# Patient Record
Sex: Male | Born: 1990
Health system: Southern US, Community
[De-identification: ages and names within clinical notes are randomized; demographics above are authoritative.]

## PROBLEM LIST (undated history)

## (undated) DIAGNOSIS — F419 Anxiety disorder, unspecified: Secondary | ICD-10-CM

---
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
## 2009-01-31 ENCOUNTER — Emergency Department (HOSPITAL_COMMUNITY): Admission: EM | Admit: 2009-01-31 | Discharge: 2009-01-31 | Payer: Self-pay | Admitting: Family Medicine

## 2009-04-07 ENCOUNTER — Emergency Department (HOSPITAL_COMMUNITY): Admission: EM | Admit: 2009-04-07 | Discharge: 2009-04-07 | Payer: Self-pay | Admitting: Emergency Medicine

## 2010-05-28 LAB — COMPREHENSIVE METABOLIC PANEL
Albumin: 4.4 g/dL (ref 3.5–5.2)
Alkaline Phosphatase: 99 U/L (ref 39–117)
BUN: 4 mg/dL — ABNORMAL LOW (ref 6–23)
Calcium: 9.4 mg/dL (ref 8.4–10.5)
Creatinine, Ser: 0.88 mg/dL (ref 0.4–1.5)
Glucose, Bld: 91 mg/dL (ref 70–99)
Total Protein: 7.7 g/dL (ref 6.0–8.3)

## 2010-05-28 LAB — RAPID URINE DRUG SCREEN, HOSP PERFORMED
Barbiturates: NOT DETECTED
Benzodiazepines: NOT DETECTED
Cocaine: POSITIVE — AB
Opiates: NOT DETECTED

## 2010-05-28 LAB — CBC
HCT: 49.5 % (ref 39.0–52.0)
Hemoglobin: 17.4 g/dL — ABNORMAL HIGH (ref 13.0–17.0)
MCHC: 35.2 g/dL (ref 30.0–36.0)
MCV: 94.8 fL (ref 78.0–100.0)
Platelets: 253 10*3/uL (ref 150–400)
RDW: 12.3 % (ref 11.5–15.5)

## 2010-05-28 LAB — LIPASE, BLOOD: Lipase: 17 U/L (ref 11–59)

## 2010-11-17 LAB — POCT I-STAT, CHEM 8
Calcium, Ion: 1.22
Creatinine, Ser: 1
Glucose, Bld: 80
Hemoglobin: 16
Potassium: 3.9

## 2010-11-21 LAB — HSV 2 ANTIBODY, IGG: HSV 2 Glycoprotein G Ab, IgG: 0.07 IV

## 2010-11-26 LAB — URINALYSIS, ROUTINE W REFLEX MICROSCOPIC
Nitrite: NEGATIVE
Specific Gravity, Urine: 1.017
Urobilinogen, UA: 1
pH: 8

## 2010-11-26 LAB — URINE MICROSCOPIC-ADD ON

## 2010-11-26 LAB — GC/CHLAMYDIA PROBE AMP, GENITAL: Chlamydia, DNA Probe: NEGATIVE

## 2011-10-18 ENCOUNTER — Emergency Department (HOSPITAL_COMMUNITY)
Admission: EM | Admit: 2011-10-18 | Discharge: 2011-10-18 | Disposition: A | Discharge status: Home / Self Care | Payer: Self-pay | Attending: Emergency Medicine | Admitting: Emergency Medicine

## 2011-10-18 ENCOUNTER — Encounter (HOSPITAL_COMMUNITY): Payer: Self-pay | Admitting: *Deleted

## 2011-10-18 DIAGNOSIS — R112 Nausea with vomiting, unspecified: Secondary | ICD-10-CM | POA: Insufficient documentation

## 2011-10-18 DIAGNOSIS — R109 Unspecified abdominal pain: Secondary | ICD-10-CM | POA: Insufficient documentation

## 2011-10-18 LAB — CBC WITH DIFFERENTIAL/PLATELET
Basophils Relative: 0 % (ref 0–1)
Eosinophils Absolute: 0 10*3/uL (ref 0.0–0.7)
HCT: 43.3 % (ref 39.0–52.0)
Hemoglobin: 15.5 g/dL (ref 13.0–17.0)
MCH: 32.5 pg (ref 26.0–34.0)
MCHC: 35.8 g/dL (ref 30.0–36.0)
Monocytes Absolute: 0.7 10*3/uL (ref 0.1–1.0)
Monocytes Relative: 5 % (ref 3–12)

## 2011-10-18 LAB — COMPREHENSIVE METABOLIC PANEL
Albumin: 5 g/dL (ref 3.5–5.2)
BUN: 16 mg/dL (ref 6–23)
Calcium: 9.1 mg/dL (ref 8.4–10.5)
Creatinine, Ser: 0.79 mg/dL (ref 0.50–1.35)
Total Bilirubin: 0.9 mg/dL (ref 0.3–1.2)
Total Protein: 7.9 g/dL (ref 6.0–8.3)

## 2011-10-18 LAB — LIPASE, BLOOD: Lipase: 16 U/L (ref 11–59)

## 2011-10-18 MED ORDER — SODIUM CHLORIDE 0.9 % IV BOLUS (SEPSIS)
1000.0000 mL | Freq: Once | INTRAVENOUS | Status: AC
  Administered 2011-10-18: 1000 mL via INTRAVENOUS

## 2011-10-18 MED ORDER — METOCLOPRAMIDE HCL 10 MG PO TABS: 10.0000 mg | ORAL_TABLET | Freq: Four times a day (QID) | ORAL | Status: DC

## 2011-10-18 MED ORDER — ONDANSETRON HCL 4 MG/2ML IJ SOLN
INTRAMUSCULAR | Status: AC
  Administered 2011-10-18: 11:00:00
  Filled 2011-10-18: qty 2

## 2011-10-18 MED ORDER — MORPHINE SULFATE 4 MG/ML IJ SOLN
4.0000 mg | Freq: Once | INTRAMUSCULAR | Status: AC
  Administered 2011-10-18: 4 mg via INTRAVENOUS
  Filled 2011-10-18: qty 1

## 2011-10-18 MED ORDER — ONDANSETRON HCL 4 MG/2ML IJ SOLN
4.0000 mg | Freq: Once | INTRAMUSCULAR | Status: AC
  Administered 2011-10-18: 4 mg via INTRAVENOUS
  Filled 2011-10-18: qty 2

## 2011-10-18 NOTE — ED Notes (Signed)
HQI:ON62<XB> Expected date:10/18/11<BR> Expected time:10:25 AM<BR> Means of arrival:Ambulance<BR> Comments:<BR> N/V x 8 hours

## 2011-10-18 NOTE — ED Notes (Signed)
Per EMS: pt from home, called this morning with c/o n/v that began this morning. Reports drinking 8 beers last night. EMS advised pt to take ibuprofen and rest - pt tried that , did not help, called EMS again. Zofran given on route, pt has not vomited since. 20 gauge in left ac, fluids given on route. BP 120/76, pulse 90, resp 16, even/unlabored. CBG 68.  Pt reports SOB upon waking this morning - difficulty breathing, pt believes he has anxiety and may have had anxiety attack. Does not take any medication for anxiety. Denies hx of asthma. Pt reports sharp pain in RLQ and LLQ. Has gotten worse since morning.

## 2011-10-18 NOTE — ED Provider Notes (Signed)
History     CSN: [id number]  Arrival date & time 10/18/11  1027   First MD Initiated Contact with Patient 10/18/11 1036      Chief Complaint  Patient presents with  . Nausea  . Emesis    (Consider location/radiation/quality/duration/timing/severity/associated sxs/prior treatment) HPI  Tadan Ashmead is a 21 y.o. male in no acute distress brought in by EMS complaining of nausea vomiting and abdominal pain starting last night. Patient drank 8 beers last night and took ibuprofen based on EMS recommendation. Vomiting started hours before the pain. Abdominal pain at this point is 9/10 in both upper quadrants, exacerbated by wretching. Onset this morning worsening. Patient has had 10 episodes of nonbloody, nonbilious, non-coffee ground emesis. First episode was last night he woke up at 7 AM vomiting. He denies any fever, diarrhea, sick contacts, abnormal ingestions, travel or camping. He received Zofran en-route with moderate nausea relief.   History reviewed. No pertinent past medical history.  History reviewed. No pertinent past surgical history.  No family history on file.  History  Substance Use Topics  . Smoking status: Not on file  . Smokeless tobacco: Not on file  . Alcohol Use: Not on file      Review of Systems  Constitutional: Negative for fever.  Respiratory: Negative for shortness of breath.   Cardiovascular: Negative for chest pain.  Gastrointestinal: Positive for nausea, vomiting and abdominal pain. Negative for diarrhea.  All other systems reviewed and are negative.    Allergies  Review of patient's allergies indicates no known allergies.  Home Medications   Current Outpatient Rx  Name Route Sig Dispense Refill  . IBUPROFEN 200 MG PO TABS Oral Take 400 mg by mouth every 8 (eight) hours as needed. For pain.      BP 127/74  Pulse 106  Temp 97.6 F (36.4 C) (Oral)  Resp 18  SpO2 100%  Physical Exam  Nursing note and vitals reviewed. Constitutional:  He is oriented to person, place, and time. He appears well-developed and well-nourished. No distress.  HENT:  Head: Normocephalic.  Eyes: Conjunctivae and EOM are normal.  Neck: Normal range of motion.  Cardiovascular: Normal rate, regular rhythm and normal heart sounds.   Pulmonary/Chest: Effort normal and breath sounds normal. No respiratory distress. He has no wheezes. He has no rales. He exhibits no tenderness.  Abdominal: Soft. Bowel sounds are normal. He exhibits no distension and no mass. There is tenderness. There is no rebound and no guarding.       No peritoneal signs, mild tenderness to both upper quadrants.  Musculoskeletal: Normal range of motion.  Neurological: He is alert and oriented to person, place, and time.  Skin: Skin is warm.  Psychiatric: He has a normal mood and affect.    ED Course  Procedures (including critical care time)  Labs Reviewed  CBC WITH DIFFERENTIAL - Abnormal; Notable for the following:    WBC 14.3 (*)     Neutrophils Relative 91 (*)     Neutro Abs 13.0 (*)     Lymphocytes Relative 4 (*)     Lymphs Abs 0.6 (*)     All other components within normal limits  COMPREHENSIVE METABOLIC PANEL - Abnormal; Notable for the following:    CO2 18 (*)     Glucose, Bld 69 (*)     AST 46 (*)     All other components within normal limits  LIPASE, BLOOD   No results found.   1. Nausea and vomiting  MDM  Vomiting likely secondary to alcohol over use in combination with NSAID irritation from Motrin. I will draw belly labs,  bolus fluids and give patient morphine and Zofran. Abdominal exam is benign patient has no prior history of any abdominal surgeries, I will consuct serial abdominal exams.  12:18 PM patient examined in no acute distress denies any nausea or abdominal pain. Abdominal exam is benign with no tenderness to palpation or peritoneal signs. Patient is on his seconds liter bolus and will give him a fluid challenge and discharge him  1:01  PM patient tolerating by mouth liquids and remains pain and nausea free. Abdominal exam remains benign.   Pt verbalized understanding and agrees with care plan. Outpatient follow-up and return precautions given.     New Prescriptions   METOCLOPRAMIDE (REGLAN) 10 MG TABLET    Take 1 tablet (10 mg total) by mouth every 6 (six) hours.    Wynetta Emery, PA-C 10/18/11 1302

## 2011-10-18 NOTE — ED Notes (Signed)
Pt given urinal.

## 2011-10-26 NOTE — ED Provider Notes (Signed)
Medical screening examination/treatment/procedure(s) were performed by non-physician practitioner and as supervising physician I was immediately available for consultation/collaboration.   Jessie Schrieber, MD 10/26/11 0810 

## 2011-11-11 ENCOUNTER — Emergency Department (HOSPITAL_COMMUNITY): Payer: Self-pay

## 2011-11-11 ENCOUNTER — Emergency Department (INDEPENDENT_AMBULATORY_CARE_PROVIDER_SITE_OTHER): Payer: Self-pay

## 2011-11-11 ENCOUNTER — Emergency Department (INDEPENDENT_AMBULATORY_CARE_PROVIDER_SITE_OTHER)
Admission: EM | Admit: 2011-11-11 | Discharge: 2011-11-11 | Disposition: A | Discharge status: Home / Self Care | Payer: Self-pay | Source: Home / Self Care | Attending: Family Medicine | Admitting: Family Medicine

## 2011-11-11 ENCOUNTER — Encounter (HOSPITAL_COMMUNITY): Payer: Self-pay | Admitting: Emergency Medicine

## 2011-11-11 DIAGNOSIS — M546 Pain in thoracic spine: Secondary | ICD-10-CM

## 2011-11-11 MED ORDER — CYCLOBENZAPRINE HCL 10 MG PO TABS: 10.0000 mg | ORAL_TABLET | Freq: Two times a day (BID) | ORAL | Status: DC | PRN

## 2011-11-11 MED ORDER — IBUPROFEN 600 MG PO TABS: 600.0000 mg | ORAL_TABLET | Freq: Three times a day (TID) | ORAL | Status: DC | PRN

## 2011-11-11 NOTE — ED Provider Notes (Signed)
History     CSN: [id number]  Arrival date & time 11/11/11  1252   First MD Initiated Contact with Patient 11/11/11 1309      Chief Complaint  Patient presents with  . Back Pain    (Consider location/radiation/quality/duration/timing/severity/associated sxs/prior treatment) HPI Comments: 21 year old smoker male otherwise healthy. Here complaining of intermittent  middle back pain for one month. 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 States he has to bend, lift boxes and lift heavy weight frequently.    History reviewed. No pertinent past medical history.  History reviewed. No pertinent past surgical history.  No family history on file.  History  Substance Use Topics  . Smoking status: Current Every Day Smoker    Types: Cigarettes  . Smokeless tobacco: Not on file  . Alcohol Use: Yes      Review of Systems  Constitutional: Negative for fever and chills.  Respiratory: Negative for cough, chest tightness, shortness of breath and wheezing.   Cardiovascular: Negative for chest pain and palpitations.  Gastrointestinal: Negative for nausea, vomiting, abdominal pain and diarrhea.  Genitourinary: Negative for dysuria, frequency, hematuria and flank pain.  Musculoskeletal: Positive for back pain.  Skin: Negative for rash.  All other systems reviewed and are negative.    Allergies  Review of patient's allergies indicates no known allergies.  Home Medications   Current Outpatient Rx  Name Route Sig Dispense Refill  . CYCLOBENZAPRINE HCL 10 MG PO TABS Oral Take 1 tablet (10 mg total) by mouth 2 (two) times daily as needed for muscle spasms. 20 tablet 0  . IBUPROFEN 600 MG PO TABS Oral Take 1 tablet (600 mg total) by mouth every 8 (eight) hours as needed for pain. 20 tablet 0    BP 130/84  Pulse 66  Temp 97.5 F (36.4 C) (Oral)  Resp 16  SpO2 98%  Physical Exam  Nursing note and vitals  reviewed. Constitutional: He is oriented to person, place, and time. He appears well-developed and well-nourished. No distress.  HENT:  Head: Normocephalic and atraumatic.  Nose: Nose normal.  Mouth/Throat: Oropharynx is clear and moist. No oropharyngeal exudate.  Eyes: Conjunctivae normal and EOM are normal. Pupils are equal, round, and reactive to light. No scleral icterus.  Neck: Normal range of motion. Neck supple. No thyromegaly present.  Cardiovascular: Normal rate, regular rhythm and normal heart sounds.  Exam reveals no gallop and no friction rub.   No murmur heard. Pulmonary/Chest: Effort normal and breath sounds normal. No respiratory distress. He has no wheezes. He has no rales. He exhibits no tenderness.  Abdominal: Soft. Bowel sounds are normal. There is no tenderness.  Musculoskeletal:       Spine central: No obvious scoliosis or kyphosis. No pain over bone processes in entire spine.  Good range of motion.  Able to bend forward (flexion) past 90 degrees and posteriorly (extension) 25 degrees.  Normal bilateral lateralization 25 degress. Despite good range of motion there is reported pain in right middle thoraxic paravertebral area, specially if rotation of torso towards right side. Impress increased muscle tone in same area.      Lymphadenopathy:    He has no cervical adenopathy.  Neurological: He is alert and oriented to person, place, and time.  Skin: No rash noted.    ED Course  Procedures (including critical care time)  Labs Reviewed - No data to display No results found.   1. Back pain,  thoracic       MDM  Normal chest x-rays and thoracic spine X-rays.treated with flexeril and ibuprofen. Back stretching exercises reccommended once pain improves. Spine specialist referral as needed.        Sharin Grave, MD 11/14/11 [id number]

## 2011-11-11 NOTE — ED Notes (Signed)
C/o back pain for one month.  Reports mid back pain.  Pain is intermittent.  Pain hurts the most when lifting heavy objects per patient.  Also reports right side pain that started 2 days ago, radiates to the front of body.  Reports this pain as sharp and"catching" when turning quickly.

## 2013-01-12 ENCOUNTER — Encounter (HOSPITAL_COMMUNITY): Payer: Self-pay | Admitting: Emergency Medicine

## 2013-01-12 ENCOUNTER — Emergency Department (HOSPITAL_COMMUNITY)
Admission: EM | Admit: 2013-01-12 | Discharge: 2013-01-12 | Disposition: A | Discharge status: Home / Self Care | Payer: Self-pay | Attending: Emergency Medicine | Admitting: Emergency Medicine

## 2013-01-12 DIAGNOSIS — F1010 Alcohol abuse, uncomplicated: Secondary | ICD-10-CM | POA: Insufficient documentation

## 2013-01-12 DIAGNOSIS — F419 Anxiety disorder, unspecified: Secondary | ICD-10-CM

## 2013-01-12 DIAGNOSIS — K2970 Gastritis, unspecified, without bleeding: Secondary | ICD-10-CM | POA: Insufficient documentation

## 2013-01-12 DIAGNOSIS — F17200 Nicotine dependence, unspecified, uncomplicated: Secondary | ICD-10-CM | POA: Insufficient documentation

## 2013-01-12 DIAGNOSIS — F411 Generalized anxiety disorder: Secondary | ICD-10-CM | POA: Insufficient documentation

## 2013-01-12 LAB — COMPREHENSIVE METABOLIC PANEL
AST: 26 U/L (ref 0–37)
Albumin: 4.7 g/dL (ref 3.5–5.2)
Alkaline Phosphatase: 78 U/L (ref 39–117)
BUN: 11 mg/dL (ref 6–23)
CO2: 22 mEq/L (ref 19–32)
Chloride: 98 mEq/L (ref 96–112)
Creatinine, Ser: 0.89 mg/dL (ref 0.50–1.35)
GFR calc non Af Amer: >90 mL/min (ref >90)
Potassium: 3.5 mEq/L (ref 3.5–5.1)
Total Bilirubin: 0.6 mg/dL (ref 0.3–1.2)

## 2013-01-12 LAB — CBC WITH DIFFERENTIAL/PLATELET
HCT: 46.3 % (ref 39.0–52.0)
Hemoglobin: 17 g/dL (ref 13.0–17.0)
Lymphocytes Relative: 9 % — ABNORMAL LOW (ref 12–46)
Monocytes Absolute: 0.8 10*3/uL (ref 0.1–1.0)
Monocytes Relative: 4 % (ref 3–12)
Neutro Abs: 15.8 10*3/uL — ABNORMAL HIGH (ref 1.7–7.7)
Neutrophils Relative %: 86 % — ABNORMAL HIGH (ref 43–77)
RBC: 5.12 MIL/uL (ref 4.22–5.81)
WBC: 18.3 10*3/uL — ABNORMAL HIGH (ref 4.0–10.5)

## 2013-01-12 MED ORDER — SODIUM CHLORIDE 0.9 % IV BOLUS (SEPSIS)
1000.0000 mL | Freq: Once | INTRAVENOUS | Status: AC
  Administered 2013-01-12: 1000 mL via INTRAVENOUS

## 2013-01-12 MED ORDER — PANTOPRAZOLE SODIUM 40 MG IV SOLR
40.0000 mg | Freq: Once | INTRAVENOUS | Status: AC
  Administered 2013-01-12: 40 mg via INTRAVENOUS
  Filled 2013-01-12: qty 40

## 2013-01-12 MED ORDER — LORAZEPAM 2 MG/ML IJ SOLN
1.0000 mg | Freq: Once | INTRAMUSCULAR | Status: AC
  Administered 2013-01-12: 1 mg via INTRAVENOUS
  Filled 2013-01-12: qty 1

## 2013-01-12 NOTE — ED Notes (Signed)
Pt blood sugar 205

## 2013-01-12 NOTE — ED Notes (Signed)
Bed: ZO10 Expected date: 01/12/13 Expected time:  Means of arrival: Ambulance Comments: Hypoglycemia

## 2013-01-12 NOTE — ED Notes (Signed)
Pt arrived to ED with a complaint of a panic attack after he had drank alcohol last evening.  Pt states he drank "a lot" of alcohol.  Pt states he has been unable to eat.  Pt states he woke up and was unable to go back to sleep.  Pt sister has anxiety so he saw similar symptoms manifesting in himself.  Pt was give and AMP of D10 and a Zofran by EMS.  EMS states his CBG was 61 and after the D10 it was 225.  Pt is not a diabetic and is bewildered when questioned about his sugar. Pt is stating that he does have a headache

## 2013-01-12 NOTE — ED Provider Notes (Signed)
CSN: [id number]     Arrival date & time 01/12/13  1152 History   First MD Initiated Contact with Patient 01/12/13 1157     Chief Complaint  Patient presents with  . Panic Attack   (Consider location/radiation/quality/duration/timing/severity/associated sxs/prior Treatment) HPI Comments: Patient presents with nausea and vomiting. He states he drank too much alcohol last night and woke up early this morning with nausea and vomiting. He states he's feeling better now but still has some achiness in his stomach and vomiting. He's also feeling very anxious. He says that his anxiety tends to escalate when he is under periods of stress. He thinks it was triggered this morning due to his vomiting. He denies any suicidal ideations, homicidal ideations or hallucinations. He denies any hematemesis. He called 911 and when EMS got there they found to have a blood sugar of 61. He subsequently gave him D10 and his blood sugar on recheck was 205.   History reviewed. No pertinent past medical history. History reviewed. No pertinent past surgical history. History reviewed. No pertinent family history. History  Substance Use Topics  . Smoking status: Current Every Day Smoker    Types: Cigarettes  . Smokeless tobacco: Not on file  . Alcohol Use: Yes    Review of Systems  Constitutional: Negative for fever, chills, diaphoresis and fatigue.  HENT: Negative for congestion, rhinorrhea and sneezing.   Eyes: Negative.   Respiratory: Negative for cough, chest tightness and shortness of breath.   Cardiovascular: Negative for chest pain and leg swelling.  Gastrointestinal: Positive for nausea, vomiting and abdominal pain. Negative for diarrhea and blood in stool.  Genitourinary: Negative for frequency, hematuria, flank pain and difficulty urinating.  Musculoskeletal: Negative for arthralgias and back pain.  Skin: Negative for rash.  Neurological: Negative for dizziness, speech difficulty, weakness, numbness and  headaches.  Psychiatric/Behavioral: The patient is nervous/anxious.     Allergies  Review of patient's allergies indicates no known allergies.  Home Medications   Current Outpatient Rx  Name  Route  Sig  Dispense  Refill  . ibuprofen (ADVIL,MOTRIN) 200 MG tablet   Oral   Take 200 mg by mouth every 6 (six) hours as needed for headache.          BP 128/77  Pulse 88  Temp(Src) 97.9 F (36.6 C) (Oral)  Resp 18  SpO2 100% Physical Exam  Constitutional: He is oriented to person, place, and time. He appears well-developed and well-nourished.  HENT:  Head: Normocephalic and atraumatic.  Eyes: Pupils are equal, round, and reactive to light.  Neck: Normal range of motion. Neck supple.  Cardiovascular: Normal rate, regular rhythm and normal heart sounds.   Pulmonary/Chest: Effort normal and breath sounds normal. No respiratory distress. He has no wheezes. He has no rales. He exhibits no tenderness.  Abdominal: Soft. Bowel sounds are normal. There is no tenderness. There is no rebound and no guarding.  Musculoskeletal: Normal range of motion. He exhibits no edema.  Lymphadenopathy:    He has no cervical adenopathy.  Neurological: He is alert and oriented to person, place, and time.  Skin: Skin is warm and dry. No rash noted.  Psychiatric: He has a normal mood and affect.    ED Course  Procedures (including critical care time) Labs Review  Results for orders placed during the hospital encounter of 01/12/13  CBC WITH DIFFERENTIAL      Result Value Range   WBC 18.3 (*) 4.0 - 10.5 K/uL   RBC 5.12  4.22 -  5.81 MIL/uL   Hemoglobin 17.0  13.0 - 17.0 g/dL   HCT 16.1  09.6 - 04.5 %   MCV 90.4  78.0 - 100.0 fL   MCH 33.2  26.0 - 34.0 pg   MCHC 36.7 (*) 30.0 - 36.0 g/dL   RDW 40.9  81.1 - 91.4 %   Platelets 261  150 - 400 K/uL   Neutrophils Relative % 86 (*) 43 - 77 %   Neutro Abs 15.8 (*) 1.7 - 7.7 K/uL   Lymphocytes Relative 9 (*) 12 - 46 %   Lymphs Abs 1.7  0.7 - 4.0 K/uL    Monocytes Relative 4  3 - 12 %   Monocytes Absolute 0.8  0.1 - 1.0 K/uL   Eosinophils Relative 0  0 - 5 %   Eosinophils Absolute 0.0  0.0 - 0.7 K/uL   Basophils Relative 0  0 - 1 %   Basophils Absolute 0.0  0.0 - 0.1 K/uL  COMPREHENSIVE METABOLIC PANEL      Result Value Range   Sodium 137  135 - 145 mEq/L   Potassium 3.5  3.5 - 5.1 mEq/L   Chloride 98  96 - 112 mEq/L   CO2 22  19 - 32 mEq/L   Glucose, Bld 149 (*) 70 - 99 mg/dL   BUN 11  6 - 23 mg/dL   Creatinine, Ser 7.82  0.50 - 1.35 mg/dL   Calcium 9.6  8.4 - 95.6 mg/dL   Total Protein 8.0  6.0 - 8.3 g/dL   Albumin 4.7  3.5 - 5.2 g/dL   AST 26  0 - 37 U/L   ALT 23  0 - 53 U/L   Alkaline Phosphatase 78  39 - 117 U/L   Total Bilirubin 0.6  0.3 - 1.2 mg/dL   GFR calc non Af Amer >90  >90 mL/min   GFR calc Af Amer >90  >90 mL/min  LIPASE, BLOOD      Result Value Range   Lipase 19  11 - 59 U/L   No results found.   Imaging Review No results found.  EKG Interpretation   None       MDM   1. Gastritis   2. Anxiety    Patient presents with nausea and vomiting after ingesting an excessive amount of alcohol. He's completely alert and oriented. He has no abdominal tenderness on exam. He was given IV fluids one dose of Ativan and is feeling much better. He is tolerating by mouth fluids. He was discharged home in good condition. He has no suicidal ideations.    Rolan Bucco, MD 01/12/13 1344

## 2013-01-12 NOTE — ED Notes (Signed)
Pt given a ginger ale and encouraged to drink slowly

## 2013-06-21 ENCOUNTER — Encounter (HOSPITAL_COMMUNITY): Payer: Self-pay | Admitting: Emergency Medicine

## 2013-06-21 ENCOUNTER — Emergency Department (HOSPITAL_COMMUNITY)
Admission: EM | Admit: 2013-06-21 | Discharge: 2013-06-21 | Disposition: A | Discharge status: Home / Self Care | Payer: Self-pay | Attending: Emergency Medicine | Admitting: Emergency Medicine

## 2013-06-21 DIAGNOSIS — F1210 Cannabis abuse, uncomplicated: Secondary | ICD-10-CM | POA: Insufficient documentation

## 2013-06-21 DIAGNOSIS — F17200 Nicotine dependence, unspecified, uncomplicated: Secondary | ICD-10-CM | POA: Insufficient documentation

## 2013-06-21 DIAGNOSIS — F411 Generalized anxiety disorder: Secondary | ICD-10-CM | POA: Insufficient documentation

## 2013-06-21 DIAGNOSIS — F419 Anxiety disorder, unspecified: Secondary | ICD-10-CM

## 2013-06-21 HISTORY — DX: Anxiety disorder, unspecified: F41.9

## 2013-06-21 NOTE — ED Provider Notes (Signed)
CSN: [id number]     Arrival date & time 06/21/13  1022 History   First MD Initiated Contact with Patient 06/21/13 1028     Chief Complaint  Patient presents with  . Insomnia after smoking marijuana      (Consider location/radiation/quality/duration/timing/severity/associated sxs/prior Treatment) HPI Comments: Patient presents emergency department with chief complaint of anxiety. He states that he suffers with anxiety. States that last night, he smoked some marijuana, and has been unable to sleep. He states that his anxiety manifests itself with the most "inconvenient times." He states that he does not know what to do to help alleviate his symptoms, and states that smoking marijuana only helps sometimes. He denies any other associated symptoms. Denies any SI or HI.  The history is provided by the patient. No language interpreter was used.    Past Medical History  Diagnosis Date  . Anxiety    History reviewed. No pertinent past surgical history. History reviewed. No pertinent family history. History  Substance Use Topics  . Smoking status: Current Every Day Smoker    Types: Cigarettes  . Smokeless tobacco: Not on file  . Alcohol Use: Yes    Review of Systems  Constitutional: Negative for fever and chills.  Respiratory: Negative for shortness of breath.   Cardiovascular: Negative for chest pain.  Gastrointestinal: Negative for nausea, vomiting, diarrhea and constipation.  Genitourinary: Negative for dysuria.      Allergies  Review of patient's allergies indicates no known allergies.  Home Medications   Prior to Admission medications   Medication Sig Start Date End Date Taking? Authorizing Provider  ibuprofen (ADVIL,MOTRIN) 200 MG tablet Take 200 mg by mouth every 6 (six) hours as needed for headache.    Historical Provider, MD   BP 133/89  Pulse 68  Temp(Src) 98.5 F (36.9 C) (Oral)  Resp 16  SpO2 100% Physical Exam  Nursing note and vitals  reviewed. Constitutional: He is oriented to person, place, and time. He appears well-developed and well-nourished.  HENT:  Head: Normocephalic and atraumatic.  Eyes: Conjunctivae and EOM are normal. Pupils are equal, round, and reactive to light. Right eye exhibits no discharge. Left eye exhibits no discharge. No scleral icterus.  Neck: Normal range of motion. Neck supple. No JVD present.  Cardiovascular: Normal rate, regular rhythm and normal heart sounds.  Exam reveals no gallop and no friction rub.   No murmur heard. Pulmonary/Chest: Effort normal and breath sounds normal. No respiratory distress. He has no wheezes. He has no rales. He exhibits no tenderness.  Abdominal: Soft. He exhibits no distension and no mass. There is no tenderness. There is no rebound and no guarding.  Musculoskeletal: Normal range of motion. He exhibits no edema and no tenderness.  Neurological: He is alert and oriented to person, place, and time.  Skin: Skin is warm and dry.  Psychiatric: He has a normal mood and affect. His behavior is normal. Judgment and thought content normal.    ED Course  Procedures (including critical care time) Labs Review Labs Reviewed - No data to display  Imaging Review No results found.   EKG Interpretation None      MDM   Final diagnoses:  Anxiety   Patient with anxiety. No SI/HI. Will give outpatient resources. Patient seems reasonable, and has capacity to make his own decisions. He'll that he is stable and ready for discharge. Have given him the resource guide.    Roxy Horsemanobert Trevious Rampey, PA-C 06/21/13 1112

## 2013-06-21 NOTE — ED Notes (Addendum)
Pt states that he went out last night and smoked weed.  States that he has not been able to go to sleep since. Only states that he had 2 beers and weed.  Smoked weed around 11pm-1am.

## 2013-06-21 NOTE — Discharge Instructions (Signed)
Panic Attacks °Panic attacks are sudden, short-lived surges of severe anxiety, fear, or discomfort. They may occur for no reason when you are relaxed, when you are anxious, or when you are sleeping. Panic attacks may occur for a number of reasons:  °· Healthy people occasionally have panic attacks in extreme, life-threatening situations, such as war or natural disasters. Normal anxiety is a protective mechanism of the body that helps us react to danger (fight or flight response). °· Panic attacks are often seen with anxiety disorders, such as panic disorder, social anxiety disorder, generalized anxiety disorder, and phobias. Anxiety disorders cause excessive or uncontrollable anxiety. They may interfere with your relationships or other life activities. °· Panic attacks are sometimes seen with other mental illnesses such as depression and posttraumatic stress disorder. °· Certain medical conditions, prescription medicines, and drugs of abuse can cause panic attacks. °SYMPTOMS  °Panic attacks start suddenly, peak within 20 minutes, and are accompanied by four or more of the following symptoms: °· Pounding heart or fast heart rate (palpitations). °· Sweating. °· Trembling or shaking. °· Shortness of breath or feeling smothered. °· Feeling choked. °· Chest pain or discomfort. °· Nausea or strange feeling in your stomach. °· Dizziness, lightheadedness, or feeling like you will faint. °· Chills or hot flushes. °· Numbness or tingling in your lips or hands and feet. °· Feeling that things are not real or feeling that you are not yourself. °· Fear of losing control or going crazy. °· Fear of dying. °Some of these symptoms can mimic serious medical conditions. For example, you may think you are having a heart attack. Although panic attacks can be very scary, they are not life threatening. °DIAGNOSIS  °Panic attacks are diagnosed through an assessment by your health care provider. Your health care provider will ask questions  about your symptoms, such as where and when they occurred. Your health care provider will also ask about your medical history and use of alcohol and drugs, including prescription medicines. Your health care provider may order blood tests or other studies to rule out a serious medical condition. Your health care provider may refer you to a mental health professional for further evaluation. °TREATMENT  °· Most healthy people who have one or two panic attacks in an extreme, life-threatening situation will not require treatment. °· The treatment for panic attacks associated with anxiety disorders or other mental illness typically involves counseling with a mental health professional, medicine, or a combination of both. Your health care provider will help determine what treatment is best for you. °· Panic attacks due to physical illness usually goes away with treatment of the illness. If prescription medicine is causing panic attacks, talk with your health care provider about stopping the medicine, decreasing the dose, or substituting another medicine. °· Panic attacks due to alcohol or drug abuse goes away with abstinence. Some adults need professional help in order to stop drinking or using drugs. °HOME CARE INSTRUCTIONS  °· Take all your medicines as prescribed.   °· Check with your health care provider before starting new prescription or over-the-counter medicines. °· Keep all follow up appointments with your health care provider. °SEEK MEDICAL CARE IF: °· You are not able to take your medicines as prescribed. °· Your symptoms do not improve or get worse. °SEEK IMMEDIATE MEDICAL CARE IF:  °· You experience panic attack symptoms that are different than your usual symptoms. °· You have serious thoughts about hurting yourself or others. °· You are taking medicine for panic attacks and   have a serious side effect. °MAKE SURE YOU: °· Understand these instructions. °· Will watch your condition. °· Will get help right away  if you are not doing well or get worse. °Document Released: 02/02/2005 Document Revised: 11/23/2012 Document Reviewed: 09/16/2012 °Exit[employer]® Patient Information ©2014 [employer]. °Generalized Anxiety Disorder °Generalized anxiety disorder (GAD) is a mental disorder. It interferes with life functions, including relationships, work, and school. °GAD is different from normal anxiety, which everyone experiences at some point in their lives in response to specific life events and activities. Normal anxiety actually helps us prepare for and get through these life events and activities. Normal anxiety goes away after the event or activity is over.  °GAD causes anxiety that is not necessarily related to specific events or activities. It also causes excess anxiety in proportion to specific events or activities. The anxiety associated with GAD is also difficult to control. GAD can vary from mild to severe. People with severe GAD can have intense waves of anxiety with physical symptoms (panic attacks).  °SYMPTOMS °The anxiety and worry associated with GAD are difficult to control. This anxiety and worry are related to many life events and activities and also occur more days than not for 6 months or longer. People with GAD also have three or more of the following symptoms (one or more in children): °· Restlessness.   °· Fatigue. °· Difficulty concentrating.   °· Irritability. °· Muscle tension. °· Difficulty sleeping or unsatisfying sleep. °DIAGNOSIS °GAD is diagnosed through an assessment by your caregiver. Your caregiver will ask you questions about your mood, physical symptoms, and events in your life. Your caregiver may ask you about your medical history and use of alcohol or drugs, including prescription medications. Your caregiver may also do a physical exam and blood tests. Certain medical conditions and the use of certain substances can cause symptoms similar to those associated with GAD. Your caregiver may refer you  to a mental health specialist for further evaluation. °TREATMENT °The following therapies are usually used to treat GAD:  °· Medication Antidepressant medication usually is prescribed for long-term daily control. Antianxiety medications may be added in severe cases, especially when panic attacks occur.   °· Talk therapy (psychotherapy) Certain types of talk therapy can be helpful in treating GAD by providing support, education, and guidance. A form of talk therapy called cognitive behavioral therapy can teach you healthy ways to think about and react to daily life events and activities. °· Stress management techniques These include yoga, meditation, and exercise and can be very helpful when they are practiced regularly. °A mental health specialist can help determine which treatment is best for you. Some people see improvement with one therapy. However, other people require a combination of therapies. °Document Released: 05/30/2012 Document Reviewed: 05/30/2012 °[employer]® Patient Information ©2014 [employer]. ° °

## 2013-06-21 NOTE — ED Provider Notes (Signed)
Medical screening examination/treatment/procedure(s) were performed by non-physician practitioner and as supervising physician I was immediately available for consultation/collaboration.   EKG Interpretation None        Cody CamelScott T Tariyah Pendry, MD 06/21/13 1644

## 2014-07-26 ENCOUNTER — Emergency Department (HOSPITAL_COMMUNITY)
Admission: EM | Admit: 2014-07-26 | Discharge: 2014-07-26 | Disposition: A | Discharge status: Home / Self Care | Payer: Self-pay | Attending: Emergency Medicine | Admitting: Emergency Medicine

## 2014-07-26 ENCOUNTER — Encounter (HOSPITAL_COMMUNITY): Payer: Self-pay | Admitting: *Deleted

## 2014-07-26 DIAGNOSIS — R509 Fever, unspecified: Secondary | ICD-10-CM | POA: Insufficient documentation

## 2014-07-26 DIAGNOSIS — Z8659 Personal history of other mental and behavioral disorders: Secondary | ICD-10-CM | POA: Insufficient documentation

## 2014-07-26 DIAGNOSIS — R110 Nausea: Secondary | ICD-10-CM | POA: Insufficient documentation

## 2014-07-26 DIAGNOSIS — Z720 Tobacco use: Secondary | ICD-10-CM | POA: Insufficient documentation

## 2014-07-26 DIAGNOSIS — K047 Periapical abscess without sinus: Secondary | ICD-10-CM

## 2014-07-26 MED ORDER — NAPROXEN 500 MG PO TABS: 500.0000 mg | ORAL_TABLET | Freq: Two times a day (BID) | ORAL | Status: DC

## 2014-07-26 MED ORDER — PENICILLIN V POTASSIUM 500 MG PO TABS: 500.0000 mg | ORAL_TABLET | Freq: Three times a day (TID) | ORAL | Status: DC

## 2014-07-26 NOTE — Discharge Instructions (Signed)
Abscessed Tooth An abscessed tooth is an infection around your tooth. It may be caused by holes or damage to the tooth (cavity) or a dental disease. An abscessed tooth causes mild to very bad pain in and around the tooth. See your dentist right away if you have tooth or gum pain. HOME CARE  Take your medicine as told. Finish it even if you start to feel better.  Do not drive after taking pain medicine.  Rinse your mouth (gargle) often with salt water ( teaspoon salt in 8 ounces of warm water).  Do not apply heat to the outside of your face. GET HELP RIGHT AWAY IF:   You have a temperature by mouth above 102 F (38.9 C), not controlled by medicine.  You have chills and a very bad headache.  You have problems breathing or swallowing.  Your mouth will not open.  You develop puffiness (swelling) on the neck or around the eye.  Your pain is not helped by medicine.  Your pain is getting worse instead of better. MAKE SURE YOU:   Understand these instructions.  Will watch your condition.  Will get help right away if you are not doing well or get worse. Document Released: 07/22/2007 Document Revised: 04/27/2011 Document Reviewed: 05/13/2010 Exit[employer] Patient Information 2015 [employer]. This information is not intended to replace advice given to you by your health care provider. Make sure you discuss any questions you have with your health care provider.  

## 2014-07-26 NOTE — ED Provider Notes (Signed)
CSN: [id number]     Arrival date & time 07/26/14  1607 History  This chart was scribed for non-physician practitioner Fayrene Helper, PA, working with Eber Hong, MD, by Tanda Rockers, ED Scribe. This patient was seen in room WTR7/WTR7 and the patient's care was started at 4:48 PM.    Chief Complaint  Patient presents with  . Oral Swelling   The history is provided by the patient. No language interpreter was used.     HPI Comments: Cody Dean is a 24 y.o. male who presents to the Emergency Department complaining of left upper dental abscess x 4 days, worsening last 2 days. He notes facial swelling to the left side. He rates the pain as a 10/10 on the pain scale. He does have a hx of fractured tooth on that side. Pt states that the pain is exacerbated with opening his mouth. He also complains of nausea and subjective fevers. He is unable to sleep due to the pain. He has been applying heat and taking Tylenol and Advil with mild relief. Pt was referred to a dentist but was told by the nurse that he needs to be placed on antibiotics before they can see him. Denies chills, drainage, abnormal tase in mouth, or any other symptoms.     Past Medical History  Diagnosis Date  . Anxiety    History reviewed. No pertinent past surgical history. No family history on file. History  Substance Use Topics  . Smoking status: Current Every Day Smoker    Types: Cigarettes  . Smokeless tobacco: Not on file  . Alcohol Use: Yes    Review of Systems  Constitutional: Positive for fever. Negative for chills.  HENT: Positive for dental problem and facial swelling.   Gastrointestinal: Positive for nausea.      Allergies  Review of patient's allergies indicates no known allergies.  Home Medications   Prior to Admission medications   Not on File   Triage Vitals: BP 142/73 mmHg  Pulse 75  Temp(Src) 98.6 F (37 C) (Oral)  Resp 16  SpO2 96%   Physical Exam  Constitutional: He is oriented to  person, place, and time. He appears well-developed and well-nourished. No distress.  HENT:  Head: Normocephalic and atraumatic.  Tenderness along left upper gumline with adjacent dental decay most noticeable at tooth #12.  Some exudate noted from the gum.  No trismus.  Facial swelling noted along left mandibular region with anterior cervical lymphadenopathy.   Eyes: Conjunctivae and EOM are normal.  Neck: Neck supple. No tracheal deviation present.  Cardiovascular: Normal rate.   Pulmonary/Chest: Effort normal. No respiratory distress.  Musculoskeletal: Normal range of motion.  Neurological: He is alert and oriented to person, place, and time.  Skin: Skin is warm and dry.  Psychiatric: He has a normal mood and affect. His behavior is normal.  Nursing note and vitals reviewed.   ED Course  Procedures (including critical care time)  DIAGNOSTIC STUDIES: Oxygen Saturation is 96% on RA, normal by my interpretation.    COORDINATION OF CARE: 4:50 PM-patient with evidence of  Abscess with facial involvement. No trismus. Discussed treatment plan which includes RX antibiotics and NSAIDS with pt at bedside and pt agreed to plan. Advised pt to call dentist this week to schedule an appointment.   Labs Review Labs Reviewed - No data to display  Imaging Review No results found.   EKG Interpretation None      MDM   Final diagnoses:  Periapical abscess with  facial involvement   BP 142/73 mmHg  Pulse 75  Temp(Src) 98.6 F (37 C) (Oral)  Resp 16  SpO2 96%   I personally performed the services described in this documentation, which was scribed in my presence. The recorded information has been reviewed and is accurate.       Fayrene Helper, PA-C 07/26/14 1655  Eber Hong, MD 07/27/14 [phone]

## 2014-07-26 NOTE — ED Notes (Signed)
Pt reports L upper dental abscess x 3-4 days.  Pt reports bad tooth

## 2014-11-03 ENCOUNTER — Emergency Department (INDEPENDENT_AMBULATORY_CARE_PROVIDER_SITE_OTHER): Payer: Self-pay

## 2014-11-03 ENCOUNTER — Emergency Department (INDEPENDENT_AMBULATORY_CARE_PROVIDER_SITE_OTHER)
Admission: EM | Admit: 2014-11-03 | Discharge: 2014-11-03 | Disposition: A | Discharge status: Home / Self Care | Payer: Self-pay | Source: Home / Self Care | Attending: Family Medicine | Admitting: Family Medicine

## 2014-11-03 DIAGNOSIS — S3981XA Other specified injuries of abdomen, initial encounter: Secondary | ICD-10-CM

## 2014-11-03 DIAGNOSIS — S6991XA Unspecified injury of right wrist, hand and finger(s), initial encounter: Secondary | ICD-10-CM

## 2014-11-03 DIAGNOSIS — S3991XA Unspecified injury of abdomen, initial encounter: Secondary | ICD-10-CM

## 2014-11-03 MED ORDER — NAPROXEN 500 MG PO TABS: 500.0000 mg | ORAL_TABLET | Freq: Two times a day (BID) | ORAL | Status: DC

## 2014-11-03 NOTE — Discharge Instructions (Signed)
It was a pleasure to see you today.  The x-ray of the right hand does not show evidence of a fracture (broken bone).  I recommend keeping the hand in a removable splint for the coming week, applying ice periodically.   Naproxen , one tablet by mouth every 12 hours as needed for the pain.   Please follow up with your regular doctor if the pain does not resolve or if it worsens, or with any other concerns.
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
## 2015-09-01 ENCOUNTER — Emergency Department (HOSPITAL_COMMUNITY): Payer: Self-pay

## 2015-09-01 ENCOUNTER — Emergency Department (HOSPITAL_COMMUNITY)
Admission: EM | Admit: 2015-09-01 | Discharge: 2015-09-01 | Disposition: A | Discharge status: Home / Self Care | Payer: Self-pay | Attending: Emergency Medicine | Admitting: Emergency Medicine

## 2015-09-01 ENCOUNTER — Encounter (HOSPITAL_COMMUNITY): Payer: Self-pay | Admitting: Emergency Medicine

## 2015-09-01 DIAGNOSIS — Y999 Unspecified external cause status: Secondary | ICD-10-CM | POA: Insufficient documentation

## 2015-09-01 DIAGNOSIS — Y929 Unspecified place or not applicable: Secondary | ICD-10-CM | POA: Insufficient documentation

## 2015-09-01 DIAGNOSIS — S61432A Puncture wound without foreign body of left hand, initial encounter: Secondary | ICD-10-CM | POA: Insufficient documentation

## 2015-09-01 DIAGNOSIS — T148XXA Other injury of unspecified body region, initial encounter: Secondary | ICD-10-CM

## 2015-09-01 DIAGNOSIS — F17210 Nicotine dependence, cigarettes, uncomplicated: Secondary | ICD-10-CM | POA: Insufficient documentation

## 2015-09-01 DIAGNOSIS — W268XXA Contact with other sharp object(s), not elsewhere classified, initial encounter: Secondary | ICD-10-CM | POA: Insufficient documentation

## 2015-09-01 DIAGNOSIS — Y9339 Activity, other involving climbing, rappelling and jumping off: Secondary | ICD-10-CM | POA: Insufficient documentation

## 2015-09-01 MED ORDER — TETANUS-DIPHTH-ACELL PERTUSSIS 5-2.5-18.5 LF-MCG/0.5 IM SUSP
0.5000 mL | Freq: Once | INTRAMUSCULAR | Status: AC
  Administered 2015-09-01: 0.5 mL via INTRAMUSCULAR
  Filled 2015-09-01: qty 0.5

## 2015-09-01 MED ORDER — CEPHALEXIN 500 MG PO CAPS: 500.0000 mg | ORAL_CAPSULE | Freq: Two times a day (BID) | ORAL | Status: DC

## 2015-09-01 MED ORDER — IBUPROFEN 600 MG PO TABS: 600.0000 mg | ORAL_TABLET | Freq: Four times a day (QID) | ORAL | Status: DC | PRN

## 2015-09-01 MED ORDER — OXYCODONE-ACETAMINOPHEN 5-325 MG PO TABS
1.0000 | ORAL_TABLET | Freq: Once | ORAL | Status: AC
Start: 2015-09-01 — End: 2015-09-01
  Administered 2015-09-01: 1 via ORAL
  Filled 2015-09-01: qty 1

## 2015-09-01 MED ORDER — LIDOCAINE HCL (PF) 1 % IJ SOLN
5.0000 mL | Freq: Once | INTRAMUSCULAR | Status: AC
  Administered 2015-09-01: 5 mL
  Filled 2015-09-01: qty 5

## 2015-09-01 MED ORDER — HYDROCODONE-ACETAMINOPHEN 5-325 MG PO TABS: 2.0000 | ORAL_TABLET | ORAL | Status: DC | PRN

## 2015-09-01 NOTE — Discharge Instructions (Signed)
Take your medications as prescribed. I also recommend resting, elevating and applying ice to affected area for 15-20 minutes 3-4 times daily to help with pain and swelling. Return to the emergency department in 3 days for wound recheck. Return to the emergency department sooner if symptoms worsen or new onset of fever, redness, swelling, warmth, drainage, numbness, tingling, weakness.

## 2015-09-01 NOTE — ED Notes (Signed)
Patient verbalized understanding of discharge instructions and denies any further needs or questions at this time. VS stable. Patient ambulatory with steady gait, declined wheelchair - escorted to ED entrance.  
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
## 2015-09-01 NOTE — ED Provider Notes (Signed)
CSN: [id number]     Arrival date & time 09/01/15  1936 History  By signing my name below, I, Evon Slackerrance Branch, attest that this documentation has been prepared under the direction and in the presence of [employer] Elizabeth Janelle Spellman, [place]. Electronically Signed: Evon Slackerrance Branch, ED Scribe. 09/01/2015. 8:14 PM.      Chief Complaint  Patient presents with  . Extremity Laceration   The history is provided by the patient. No language interpreter was used.   HPI Comments: Cody Dean is a 25 y.o. male who presents to the Emergency Department complaining of left hand laceration onset at 1 AM yesterday (>18 hours). Pt reports associated throbbing pain. The bleeding controlled. Pt states that he cut his hand while jumping a metal fence last night. Denies fall, head injury or LOC. Pt states that the pain is worse with movement. Pt states that he has applied Vaseline to the wound. Pt denies any medications PTA. Pt denies numbness or tingling. Pt states that he is not sure if his tetanus status.   Past Medical History  Diagnosis Date  . Anxiety    History reviewed. No pertinent past surgical history. No family history on file. Social History  Substance Use Topics  . Smoking status: Current Every Day Smoker    Types: Cigarettes  . Smokeless tobacco: None  . Alcohol Use: Yes    Review of Systems  Skin: Positive for wound.  Neurological: Negative for numbness.     Allergies  Review of patient's allergies indicates no known allergies.  Home Medications   Prior to Admission medications   Medication Sig Start Date End Date Taking? Authorizing Provider  cephALEXin (KEFLEX) 500 MG capsule Take 1 capsule (500 mg total) by mouth 2 (two) times daily. 09/01/15   Barrett HenleNicole Elizabeth Remi Lopata, PA-C  HYDROcodone-acetaminophen (NORCO/VICODIN) 5-325 MG tablet Take 2 tablets by mouth every 4 (four) hours as needed. 09/01/15   Barrett HenleNicole Elizabeth Amaris Garrette, PA-C  ibuprofen (ADVIL,MOTRIN) 600 MG tablet Take 1 tablet (600  mg total) by mouth every 6 (six) hours as needed. 09/01/15   Barrett HenleNicole Elizabeth Abhimanyu Cruces, PA-C  naproxen (NAPROSYN) 500 MG tablet Take 1 tablet (500 mg total) by mouth 2 (two) times daily. 07/26/14   Fayrene HelperBowie Tran, PA-C  naproxen (NAPROSYN) 500 MG tablet Take 1 tablet (500 mg total) by mouth 2 (two) times daily with a meal. 11/03/14   Barbaraann BarthelJames O Breen, MD  penicillin v potassium (VEETID) 500 MG tablet Take 1 tablet (500 mg total) by mouth 3 (three) times daily. 07/26/14   Fayrene HelperBowie Tran, PA-C   BP 140/92 mmHg  Pulse 94  Temp(Src) 99.1 F (37.3 C) (Oral)  Resp 16  Ht 5\' 7"  (1.702 m)  Wt 65.942 kg  BMI 22.76 kg/m2  SpO2 98%   Physical Exam  Constitutional: He is oriented to person, place, and time. He appears well-developed and well-nourished.  HENT:  Head: Normocephalic and atraumatic.  Eyes: Conjunctivae and EOM are normal. Right eye exhibits no discharge. Left eye exhibits no discharge. No scleral icterus.  Pulmonary/Chest: Effort normal.  Musculoskeletal:       Left hand: He exhibits decreased range of motion (due to pain), tenderness and swelling. He exhibits normal two-point discrimination, normal capillary refill, no deformity and no laceration. Normal sensation noted. Normal strength noted.       Hands: Multiple small abrasion noted to left palm, no active bleeding. Pt able to partially flex and extend left digits but endorses pain when trying to make a fist. TTP over  left pinky with mild swelling noted. 2+ radial pulse. Sensation grossly intact. Cap refill less than 2.   Neurological: He is alert and oriented to person, place, and time.  Nursing note and vitals reviewed.   ED Course  Debridement Date/Time: 09/01/2015 8:56 PM Performed by: Barrett Henle Authorized by: Barrett Henle Consent: Verbal consent obtained. Risks and benefits: risks, benefits and alternatives were discussed Consent given by: patient Patient understanding: patient states understanding of the  procedure being performed Patient identity confirmed: verbally with patient Preparation: Patient was prepped and draped in the usual sterile fashion. Local anesthesia used: yes Anesthesia: local infiltration Local anesthetic: lidocaine 1% without epinephrine Anesthetic total: 2 ml Patient tolerance: Patient tolerated the procedure well with no immediate complications Comments: Local anesthetic was used to extensively irrigate puncture wound of left palm   (including critical care time) DIAGNOSTIC STUDIES: Oxygen Saturation is 98% on RA, normal by my interpretation.    COORDINATION OF CARE: 8:14 PM-Discussed treatment plan which includes hand x-ray with pt at bedside and pt agreed to plan.     Labs Review Labs Reviewed - No data to display  Imaging Review Dg Hand Complete Left  09/01/2015  CLINICAL DATA:  Initial evaluation for acute trauma, puncture pain to palm. EXAM: LEFT HAND - COMPLETE 3+ VIEW COMPARISON:  None. FINDINGS: No acute fracture or dislocation. Joint spaces well maintained without evidence for significant degenerative or erosive arthropathy. Osseous mineralization normal. Mild soft tissue swelling at the palmar aspect of the hand. No radiopaque foreign body. IMPRESSION: 1. Mild soft tissue swelling at the palmar aspect of the hand. No radiopaque foreign body. 2. No acute fracture or dislocation. Electronically Signed   By: Rise Mu M.D.   On: 09/01/2015 21:56      EKG Interpretation None      MDM   Final diagnoses:  Puncture wound   Patient presents with puncture wound to left hand after jumping a metal fence last night. Injury occurred over 18 hours ago. VSS. Exam revealed small puncture wound to left palm with associated tenderness, no bleeding. Tenderness and pain noted to left pinky, patient able to flex and extend digits but endorses associated pain. Left upper extremity neurovascularly intact. Tetanus updated in the ED. Patient was given pain  meds in the ED. Due to reported pain with initiation of wound irrigation, wound was locally anesthetized size prior to wound irrigation. Puncture wound was extensively irrigated with NS, no foreign bodies seen. Left hand x-ray revealed mild soft tissue swelling at palmar aspect of hand, no foreign body, no acute fracture or dislocation. Due to patient's wound occurring over 18 hours ago and concern for puncture wounds, we'll leave wound open and start patient on antibiotics. Plan to discharge patient home with Keflex, pain meds, NSAIDs and symptomatically treatment. Discussed results and plan for discharge with patient. Advised patient to return to the ED in 3 days for wound recheck. Discussed return precautions with patient.  I personally performed the services described in this documentation, which was scribed in my presence. The recorded information has been reviewed and is accurate.      Satira Sark U[place], [place] 09/01/15 2231  Vanetta Mulders, MD 09/02/15 [phone]

## 2016-03-15 ENCOUNTER — Emergency Department (HOSPITAL_COMMUNITY)
Admission: EM | Admit: 2016-03-15 | Discharge: 2016-03-15 | Disposition: A | Discharge status: Home / Self Care | Payer: Self-pay | Attending: Emergency Medicine | Admitting: Emergency Medicine

## 2016-03-15 ENCOUNTER — Emergency Department (HOSPITAL_COMMUNITY): Payer: Self-pay

## 2016-03-15 ENCOUNTER — Encounter (HOSPITAL_COMMUNITY): Payer: Self-pay | Admitting: Emergency Medicine

## 2016-03-15 DIAGNOSIS — M79641 Pain in right hand: Secondary | ICD-10-CM

## 2016-03-15 DIAGNOSIS — F17210 Nicotine dependence, cigarettes, uncomplicated: Secondary | ICD-10-CM | POA: Insufficient documentation

## 2016-03-15 DIAGNOSIS — Z79899 Other long term (current) drug therapy: Secondary | ICD-10-CM | POA: Insufficient documentation

## 2016-03-15 DIAGNOSIS — G5601 Carpal tunnel syndrome, right upper limb: Secondary | ICD-10-CM | POA: Insufficient documentation

## 2016-03-15 MED ORDER — IBUPROFEN 200 MG PO TABS
600.0000 mg | ORAL_TABLET | Freq: Once | ORAL | Status: AC
  Administered 2016-03-15: 600 mg via ORAL
  Filled 2016-03-15: qty 3

## 2016-03-15 MED ORDER — ACETAMINOPHEN 325 MG PO TABS
650.0000 mg | ORAL_TABLET | Freq: Once | ORAL | Status: AC
  Administered 2016-03-15: 650 mg via ORAL
  Filled 2016-03-15: qty 2

## 2016-03-15 NOTE — ED Notes (Signed)
Bed: WTR5 Expected date:  Expected time:  Means of arrival:  Comments: 

## 2016-03-15 NOTE — Discharge Instructions (Signed)
Please read attached information on carpal tunnel syndrome.   Please wear your wrist brace every night, if possible during the day as well as allowed. Please ice and rest your wrist, the less movement the faster the inflammation will decrease.  Please take 600 mg ibuprofen and 650mg  of tylenol three times a day for the next 5-7 days to decrease inflammation.   Please follow up with hand surgeon if your symptoms do not respond to conservative therapy.    [facility] to establish care with a regular doctor for routine health care, they accept patients without health insurance and provide sliding scale payments.  This clinic may assist with specialist referral as well.

## 2016-03-15 NOTE — ED Provider Notes (Signed)
[facility]-EMERGENCY DEPT Provider Note   By signing my name below, I, Earmon Phoenix, attest that this documentation has been prepared under the direction and in the presence of Sharen Heck, PA-C. Electronically Signed: Earmon Phoenix, ED Scribe. 03/15/16. 11:18 AM.   History   Chief Complaint Chief Complaint  Patient presents with  . Hand Pain    The history is provided by the patient and medical records. No language interpreter was used.    Cody Dean is a 26 y.o. male who presents to the Emergency Department complaining of moderate throbbing, shooting right wrist pain that radiates to right thumb with onset 2.5 weeks ago, worsening two days ago. There is associated right thumb numbness and tingling.  He states he does repetitive motions with his hands for work at a job he began about three weeks ago when the pain began initially. Pt is right hand dominant.  Pt is a cook at [employer].  He reports associated swelling and decreased right thumb ROM due to pain. 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 He has been taking Advil for pain with no significant relief. Flexing the fingers into a fist increases the pain. Pt denies alleviating factors. He denies fever, chills, bruising, wounds, weakness of the right hand. He denies trauma, injury or fall. He denies any new tattoos. He is right hand dominant.  Past Medical History:  Diagnosis Date  . Anxiety     There are no active problems to display for this patient.   History reviewed. No pertinent surgical history.     Home Medications    Prior to Admission medications   Medication Sig Start Date End Date Taking? Authorizing Provider  cephALEXin (KEFLEX) 500 MG capsule Take 1 capsule (500 mg total) by mouth 2 (two) times daily. 09/01/15   Barrett Henle, PA-C  HYDROcodone-acetaminophen (NORCO/VICODIN) 5-325 MG tablet Take 2 tablets by mouth  every 4 (four) hours as needed. 09/01/15   Barrett Henle, PA-C  ibuprofen (ADVIL,MOTRIN) 600 MG tablet Take 1 tablet (600 mg total) by mouth every 6 (six) hours as needed. 09/01/15   Barrett Henle, PA-C  naproxen (NAPROSYN) 500 MG tablet Take 1 tablet (500 mg total) by mouth 2 (two) times daily. 07/26/14   Fayrene Helper, PA-C  naproxen (NAPROSYN) 500 MG tablet Take 1 tablet (500 mg total) by mouth 2 (two) times daily with a meal. 11/03/14   Barbaraann Barthel, MD  penicillin v potassium (VEETID) 500 MG tablet Take 1 tablet (500 mg total) by mouth 3 (three) times daily. 07/26/14   Fayrene Helper, PA-C    Family History No family history on file.  Social History Social History  Substance Use Topics  . Smoking status: Current Every Day Smoker    Types: Cigarettes  . Smokeless tobacco: Never Used  . Alcohol use Yes     Allergies   Patient has no known allergies.   Review of Systems Review of Systems  Constitutional: Negative for chills and fever.  HENT: Negative for congestion and sore throat.   Eyes: Negative for visual disturbance.  Respiratory: Negative for cough, chest tightness and shortness of breath.   Cardiovascular: Negative for chest pain.  Gastrointestinal: Negative for abdominal pain, constipation, diarrhea, nausea and vomiting.  Genitourinary: Negative for decreased urine volume and difficulty urinating.  Musculoskeletal: Positive for arthralgias and joint swelling.  Skin: Negative for color change, rash  and wound.  Neurological: Negative for dizziness, weakness, light-headedness, numbness and headaches.     Physical Exam Updated Vital Signs BP 120/88 (BP Location: Left Arm)   Pulse 71   Temp 98.4 F (36.9 C) (Oral)   Resp 16   Wt 160 lb (72.6 kg)   SpO2 99%   BMI 25.06 kg/m   Physical Exam  Constitutional: He is oriented to person, place, and time. He appears well-developed and well-nourished. No distress.  NAD.  HENT:  Head: Normocephalic and  atraumatic.  Right Ear: External ear normal.  Left Ear: External ear normal.  Nose: Nose normal.  Mouth/Throat: Oropharynx is clear and moist. No oropharyngeal exudate.  Moist mucous membranes.  No nasal mucosa edema. Oropharynx and tonsils pink without erythema, edema, exudates or lesions.  Uvula midline. No trismus.   Eyes: Conjunctivae and EOM are normal. Pupils are equal, round, and reactive to light. No scleral icterus.  Neck: Normal range of motion. Neck supple. No JVD present. No tracheal deviation present.  Cardiovascular: Normal rate, regular rhythm, normal heart sounds and intact distal pulses.   No murmur heard. Pulmonary/Chest: Effort normal and breath sounds normal. He has no wheezes.  Abdominal: Soft. He exhibits no distension. There is no tenderness.  Musculoskeletal: Normal range of motion. He exhibits no deformity.  + Tinel's and Phalen's on R hand Negative Finkelstein on R hand. Full R wrist and digit ROM with reported pain with R wrist flexion.  No bony tenderness over radial head or ulnar styloid on R.  No bony tenderness over R hand digits or metacarpals.   Lymphadenopathy:    He has no cervical adenopathy.  Neurological: He is alert and oriented to person, place, and time.  5/5 strength with elbow flexion and extension, bilaterally.  5/5 strength with wrist flexion and extension on R.  5/5 strength with finger abduction on R.  Good pincer and hand grip on R.  Good thumb opposition on R.  Sensation to light touch intact ulnar and radial nerve distribution on R.  Slightly decreased sensation to light touch in medial nerve distribution on R.  Sharp/dull sensation intact in R hand.   L hand sensation and strength normal.  Skin: Skin is warm and dry. Capillary refill takes less than 2 seconds.  Psychiatric: He has a normal mood and affect. His behavior is normal. Judgment and thought content normal.  Nursing note and vitals reviewed.    ED Treatments / Results   DIAGNOSTIC STUDIES: Oxygen Saturation is 99% on RA, normal by my interpretation.   COORDINATION OF CARE: 11:15 AM- Advised pt to take OTC Tylenol and Ibuprofen for pain. Will provide splint to wear at night. Encouraged pt to ice the area. Will provide work note. Pt verbalizes understanding and agrees to plan.  Medications  acetaminophen (TYLENOL) tablet 650 mg (650 mg Oral Given 03/15/16 1134)  ibuprofen (ADVIL,MOTRIN) tablet 600 mg (600 mg Oral Given 03/15/16 1134)    Labs (all labs ordered are listed, but only abnormal results are displayed) Labs Reviewed - No data to display  EKG  EKG Interpretation None       Radiology Dg Hand Complete Right  Result Date: 03/15/2016 CLINICAL DATA:  Patient with right hand injury 1 year prior. Swelling and pain to the right hand over the last 2 days. Initial encounter. EXAM: RIGHT HAND - COMPLETE 3+ VIEW COMPARISON:  None. FINDINGS: There is no evidence of fracture or dislocation. There is no evidence of arthropathy or other focal bone  abnormality. Soft tissues are unremarkable. IMPRESSION: No acute osseous abnormality. Electronically Signed   By: Annia Belt M.D.   On: 03/15/2016 11:04    Procedures Procedures (including critical care time)  Medications Ordered in ED Medications  acetaminophen (TYLENOL) tablet 650 mg (650 mg Oral Given 03/15/16 1134)  ibuprofen (ADVIL,MOTRIN) tablet 600 mg (600 mg Oral Given 03/15/16 1134)     Initial Impression / Assessment and Plan / ED Course  I have reviewed the triage vital signs and the nursing notes.  Pertinent labs & imaging results that were available during my care of the patient were reviewed by me and considered in my medical decision making (see chart for details).  Clinical Course as of Mar 15 1149  Sun Mar 15, 2016  1106 Negative R hand x-ray DG Hand Complete Right [CG]  1107 Afebrile Temp: 98.4 F (36.9 C) [CG]    Clinical Course User Index [CG] Liberty Handy, PA-C   Patient  X-Ray negative for obvious fracture or dislocation. Symptoms and physical exam findings most c/w carpal tunnel syndrome. Pt advised to take ibuprofen/tylenol, rest, ice and modify day time work tasks to decrease inflammation.  Pt advised to follow up with hand surgery if conservative tx does not improve symptoms. Patient given brace while in ED, conservative therapy recommended and discussed. Patient will be discharged home & is agreeable with above plan. Returns precautions discussed. Pt appears safe for discharge.  I personally performed the services described in this documentation, which was scribed in my presence. The recorded information has been reviewed and is accurate.    Final Clinical Impressions(s) / ED Diagnoses   Final diagnoses:  Right hand pain  Carpal tunnel syndrome of right wrist    New Prescriptions Discharge Medication List as of 03/15/2016 11:30 AM       Liberty Handy, PA-C 03/15/16 1151    Mancel Bale, MD 03/15/16 1702

## 2016-03-15 NOTE — ED Triage Notes (Signed)
Pt reports injury to r/hand one year ago. C/o swelling and pain to r/hand over l;last two days. Tx with Advil, no relief

## 2017-01-23 ENCOUNTER — Emergency Department (HOSPITAL_COMMUNITY)
Admission: EM | Admit: 2017-01-23 | Discharge: 2017-01-23 | Disposition: A | Discharge status: Home / Self Care | Payer: Self-pay | Attending: Emergency Medicine | Admitting: Emergency Medicine

## 2017-01-23 ENCOUNTER — Encounter (HOSPITAL_COMMUNITY): Payer: Self-pay | Admitting: Emergency Medicine

## 2017-01-23 DIAGNOSIS — F17210 Nicotine dependence, cigarettes, uncomplicated: Secondary | ICD-10-CM | POA: Insufficient documentation

## 2017-01-23 DIAGNOSIS — F10930 Alcohol use, unspecified with withdrawal, uncomplicated: Secondary | ICD-10-CM

## 2017-01-23 DIAGNOSIS — F10230 Alcohol dependence with withdrawal, uncomplicated: Secondary | ICD-10-CM | POA: Insufficient documentation

## 2017-01-23 LAB — URINALYSIS, ROUTINE W REFLEX MICROSCOPIC
BILIRUBIN URINE: NEGATIVE
Bacteria, UA: NONE SEEN
GLUCOSE, UA: NEGATIVE mg/dL
Hgb urine dipstick: NEGATIVE
KETONES UR: 5 mg/dL — AB
LEUKOCYTES UA: NEGATIVE
Nitrite: NEGATIVE
PH: 6 (ref 5.0–8.0)
Protein, ur: 100 mg/dL — AB
Specific Gravity, Urine: 1.026 (ref 1.005–1.030)

## 2017-01-23 LAB — COMPREHENSIVE METABOLIC PANEL
ALBUMIN: 4.6 g/dL (ref 3.5–5.0)
ALT: 33 U/L (ref 17–63)
ANION GAP: 9 (ref 5–15)
AST: 38 U/L (ref 15–41)
Alkaline Phosphatase: 71 U/L (ref 38–126)
BILIRUBIN TOTAL: 1.7 mg/dL — AB (ref 0.3–1.2)
BUN: 12 mg/dL (ref 6–20)
CO2: 24 mmol/L (ref 22–32)
Calcium: 9.3 mg/dL (ref 8.9–10.3)
Chloride: 103 mmol/L (ref 101–111)
Creatinine, Ser: 0.89 mg/dL (ref 0.61–1.24)
GFR calc Af Amer: >60 mL/min (ref >60)
Glucose, Bld: 98 mg/dL (ref 65–99)
POTASSIUM: 4 mmol/L (ref 3.5–5.1)
Sodium: 136 mmol/L (ref 135–145)
TOTAL PROTEIN: 8.2 g/dL — AB (ref 6.5–8.1)

## 2017-01-23 LAB — CBC WITH DIFFERENTIAL/PLATELET
Basophils Absolute: 0 10*3/uL (ref 0.0–0.1)
Basophils Relative: 0 %
EOS ABS: 0.2 10*3/uL (ref 0.0–0.7)
Eosinophils Relative: 3 %
HEMATOCRIT: 50 % (ref 39.0–52.0)
Hemoglobin: 18 g/dL — ABNORMAL HIGH (ref 13.0–17.0)
Lymphocytes Relative: 22 %
Lymphs Abs: 1.9 10*3/uL (ref 0.7–4.0)
MCH: 34.2 pg — ABNORMAL HIGH (ref 26.0–34.0)
MCHC: 36 g/dL (ref 30.0–36.0)
MCV: 94.9 fL (ref 78.0–100.0)
MONO ABS: 0.7 10*3/uL (ref 0.1–1.0)
MONOS PCT: 7 %
Neutro Abs: 6 10*3/uL (ref 1.7–7.7)
Neutrophils Relative %: 68 %
Platelets: 268 10*3/uL (ref 150–400)
RBC: 5.27 MIL/uL (ref 4.22–5.81)
RDW: 12.4 % (ref 11.5–15.5)
WBC: 8.8 10*3/uL (ref 4.0–10.5)

## 2017-01-23 LAB — LIPASE, BLOOD: Lipase: 26 U/L (ref 11–51)

## 2017-01-23 MED ORDER — CHLORDIAZEPOXIDE HCL 25 MG PO CAPS: ORAL_CAPSULE | ORAL | 0 refills | Status: DC

## 2017-01-23 MED ORDER — ONDANSETRON HCL 4 MG/2ML IJ SOLN
4.0000 mg | Freq: Once | INTRAMUSCULAR | Status: AC
  Administered 2017-01-23: 4 mg via INTRAVENOUS
  Filled 2017-01-23: qty 2

## 2017-01-23 MED ORDER — VITAMIN B-1 100 MG PO TABS: 100.0000 mg | ORAL_TABLET | Freq: Every day | ORAL | 0 refills | Status: DC

## 2017-01-23 MED ORDER — SODIUM CHLORIDE 0.9 % IV BOLUS (SEPSIS)
1000.0000 mL | Freq: Once | INTRAVENOUS | Status: AC
  Administered 2017-01-23: 1000 mL via INTRAVENOUS

## 2017-01-23 MED ORDER — LORAZEPAM 1 MG PO TABS
1.0000 mg | ORAL_TABLET | Freq: Once | ORAL | Status: AC
  Administered 2017-01-23: 1 mg via ORAL
  Filled 2017-01-23: qty 1

## 2017-01-23 MED ORDER — LORAZEPAM 2 MG/ML IJ SOLN
2.0000 mg | Freq: Once | INTRAMUSCULAR | Status: AC
Start: 2017-01-23 — End: 2017-01-23
  Administered 2017-01-23: 2 mg via INTRAVENOUS
  Filled 2017-01-23: qty 1

## 2017-01-23 NOTE — ED Provider Notes (Signed)
[facility]-EMERGENCY DEPT Provider Note   CSN: [id number] Arrival date & time: 01/23/17  1019     History   Chief Complaint Chief Complaint  Patient presents with  . Nausea  . Emesis  . Weakness    HPI Cody Dean is a 26 y.o. male.  HPI   26 year old male who presents with nausea, emesis, and weakness.  The patient states that over the last 2 days, he has had generalized nausea, fatigue, tremors, and anxiety.  He has a history of chronic anxiety and depression.  He denies any associated homicidal or suicidal ideations.  He states that he recently decided to quit alcohol cold [place] and his last drink was approximately 48 hours prior to the onset of symptoms.  He denies any hallucinations.  He has no history of withdrawal but does admit that he drinks at least 6-8 beers per day for at least several months.  He denies any purposeful increase in drug or alcohol use.  Denies any other drug use.  He states he has nausea, vomiting, but no overt abdominal pain.  Is also noticed some mild diarrhea.  Denies any headache.  Denies any focal numbness or weakness.  No other medical complaints.  Past Medical History:  Diagnosis Date  . Anxiety     There are no active problems to display for this patient.   History reviewed. No pertinent surgical history.     Home Medications    Prior to Admission medications   Medication Sig Start Date End Date Taking? Authorizing Provider  ibuprofen (ADVIL,MOTRIN) 200 MG tablet Take 400 mg by mouth every 6 (six) hours as needed for headache or mild pain.   Yes [provider]  cephALEXin (KEFLEX) 500 MG capsule Take 1 capsule (500 mg total) by mouth 2 (two) times daily. Patient not taking: Reported on 01/23/2017 09/01/15   Barrett Henle, PA-C  chlordiazePOXIDE (LIBRIUM) 25 MG capsule 50mg  PO TID x 1D, then 25-50mg  PO BID X 1D, then 25-50mg  PO QD X 1D 01/23/17   Shaune Pollack, MD  HYDROcodone-acetaminophen  (NORCO/VICODIN) 5-325 MG tablet Take 2 tablets by mouth every 4 (four) hours as needed. Patient not taking: Reported on 01/23/2017 09/01/15   Barrett Henle, PA-C  ibuprofen (ADVIL,MOTRIN) 600 MG tablet Take 1 tablet (600 mg total) by mouth every 6 (six) hours as needed. Patient not taking: Reported on 01/23/2017 09/01/15   Barrett Henle, PA-C  naproxen (NAPROSYN) 500 MG tablet Take 1 tablet (500 mg total) by mouth 2 (two) times daily. Patient not taking: Reported on 01/23/2017 07/26/14   Fayrene Helper, PA-C  naproxen (NAPROSYN) 500 MG tablet Take 1 tablet (500 mg total) by mouth 2 (two) times daily with a meal. Patient not taking: Reported on 01/23/2017 11/03/14   Barbaraann Barthel, MD  penicillin v potassium (VEETID) 500 MG tablet Take 1 tablet (500 mg total) by mouth 3 (three) times daily. Patient not taking: Reported on 01/23/2017 07/26/14   Fayrene Helper, PA-C  thiamine (VITAMIN B-1) 100 MG tablet Take 1 tablet (100 mg total) by mouth daily. 01/23/17   Shaune Pollack, MD    Family History No family history on file.  Social History Social History   Tobacco Use  . Smoking status: Current Every Day Smoker    Types: Cigarettes  . Smokeless tobacco: Never Used  Substance Use Topics  . Alcohol use: Yes  . Drug use: Not on file     Allergies   Patient has  no known allergies.   Review of Systems Review of Systems  Constitutional: Positive for fatigue.  Gastrointestinal: Positive for constipation, nausea and vomiting.  Neurological: Positive for tremors.  All other systems reviewed and are negative.    Physical Exam Updated Vital Signs BP 124/81 (BP Location: Left Arm)   Pulse 95   Temp 98.4 F (36.9 C) (Oral)   Resp 17   SpO2 98%   Physical Exam  Constitutional: He is oriented to person, place, and time. He appears well-developed and well-nourished. No distress.  HENT:  Head: Normocephalic and atraumatic.  Mildly dry mucous membranes  Eyes: Conjunctivae are  normal.  Neck: Neck supple.  Cardiovascular: Regular rhythm and normal heart sounds. Exam reveals no friction rub.  No murmur heard. Tachycardia  Pulmonary/Chest: Effort normal and breath sounds normal. No respiratory distress. He has no wheezes. He has no rales.  Abdominal: He exhibits no distension.  Musculoskeletal: He exhibits no edema.  Neurological: He is alert and oriented to person, place, and time. He exhibits normal muscle tone.  Mild bilateral upper extremity tremors  Skin: Skin is warm. Capillary refill takes less than 2 seconds.  Psychiatric: He has a normal mood and affect.  Nursing note and vitals reviewed.    ED Treatments / Results  Labs (all labs ordered are listed, but only abnormal results are displayed) Labs Reviewed  COMPREHENSIVE METABOLIC PANEL - Abnormal; Notable for the following components:      Result Value   Total Protein 8.2 (*)    Total Bilirubin 1.7 (*)    All other components within normal limits  URINALYSIS, ROUTINE W REFLEX MICROSCOPIC - Abnormal; Notable for the following components:   Color, Urine AMBER (*)    Ketones, ur 5 (*)    Protein, ur 100 (*)    Squamous Epithelial / LPF 0-5 (*)    All other components within normal limits  CBC WITH DIFFERENTIAL/PLATELET - Abnormal; Notable for the following components:   Hemoglobin 18.0 (*)    MCH 34.2 (*)    All other components within normal limits  LIPASE, BLOOD    EKG  EKG Interpretation None       Radiology No results found.  Procedures Procedures (including critical care time)  Medications Ordered in ED Medications  sodium chloride 0.9 % bolus 1,000 mL (0 mLs Intravenous Stopped 01/23/17 1416)  ondansetron (ZOFRAN) injection 4 mg (4 mg Intravenous Given 01/23/17 1206)  sodium chloride 0.9 % bolus 1,000 mL (0 mLs Intravenous Stopped 01/23/17 1417)  LORazepam (ATIVAN) injection 2 mg (2 mg Intravenous Given 01/23/17 1222)  LORazepam (ATIVAN) tablet 1 mg (1 mg Oral Given 01/23/17  1508)     Initial Impression / Assessment and Plan / ED Course  I have reviewed the triage vital signs and the nursing notes.  Pertinent labs & imaging results that were available during my care of the patient were reviewed by me and considered in my medical decision making (see chart for details).     26 year old male with past medical history as above here with generalized nausea, vomiting, and tremors in the setting of stopping alcohol.  I suspect most of his symptoms are actually secondary to mild alcohol withdrawal.  No hallucinations or signs of DTs.  He has no evidence of vital sign abnormalities other than mild tachycardia.  Patient has been given fluids and Ativan here with improvement in his heart rate as well as symptoms.  No seizures.  Lab work shows mild dehydration but is  otherwise reassuring and unremarkable.  His abdomen is soft I do not feel this needs imaging at this time.  Will trial him on Librium taper, refer him outpatient resources.  I discussed the importance of stopping drinking while on Librium as well as the risks of drinking on benzodiazepines.  Patient is in agreement with this plan.  I will also give him thiamine for him.  This note was prepared with assistance of Conservation officer, historic buildingsDragon voice recognition software. Occasional wrong-word or sound-a-like substitutions may have occurred due to the inherent limitations of voice recognition software.   Final Clinical Impressions(s) / ED Diagnoses   Final diagnoses:  Alcohol withdrawal syndrome without complication [facility])    ED Discharge Orders        Ordered    chlordiazePOXIDE (LIBRIUM) 25 MG capsule     01/23/17 1454    thiamine (VITAMIN B-1) 100 MG tablet  Daily     01/23/17 1454       Shaune PollackIsaacs, Eldrick Penick, MD 01/23/17 1756

## 2017-01-23 NOTE — ED Notes (Signed)
Pt trying for UA

## 2017-01-23 NOTE — ED Triage Notes (Signed)
Patient here with complaints of left abdominal pain, nausea, vomiting, and weakness since Thursday night. Unable to keep anything down. States that he recently stopped drinking alcohol and smoking, "that could be the problem".

## 2017-03-07 ENCOUNTER — Ambulatory Visit (HOSPITAL_COMMUNITY)
Admission: EM | Admit: 2017-03-07 | Discharge: 2017-03-07 | Disposition: A | Discharge status: Home / Self Care | Payer: Self-pay | Attending: Internal Medicine | Admitting: Internal Medicine

## 2017-03-07 ENCOUNTER — Encounter (HOSPITAL_COMMUNITY): Payer: Self-pay | Admitting: Family Medicine

## 2017-03-07 DIAGNOSIS — R220 Localized swelling, mass and lump, head: Secondary | ICD-10-CM

## 2017-03-07 MED ORDER — AMOXICILLIN-POT CLAVULANATE 875-125 MG PO TABS: 1.0000 | ORAL_TABLET | Freq: Two times a day (BID) | ORAL | 0 refills | Status: AC
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
## 2017-07-13 ENCOUNTER — Ambulatory Visit: Payer: Self-pay | Admitting: Family Medicine

## 2017-07-14 ENCOUNTER — Encounter: Payer: Self-pay | Admitting: Family Medicine

## 2017-12-21 ENCOUNTER — Emergency Department (HOSPITAL_COMMUNITY)
Admission: EM | Admit: 2017-12-21 | Discharge: 2017-12-21 | Disposition: A | Discharge status: Home / Self Care | Payer: Self-pay | Attending: Emergency Medicine | Admitting: Emergency Medicine

## 2017-12-21 ENCOUNTER — Encounter (HOSPITAL_COMMUNITY): Payer: Self-pay | Admitting: Emergency Medicine

## 2017-12-21 ENCOUNTER — Other Ambulatory Visit: Payer: Self-pay

## 2017-12-21 ENCOUNTER — Emergency Department (HOSPITAL_COMMUNITY): Payer: Self-pay

## 2017-12-21 DIAGNOSIS — F17210 Nicotine dependence, cigarettes, uncomplicated: Secondary | ICD-10-CM | POA: Insufficient documentation

## 2017-12-21 DIAGNOSIS — R059 Cough, unspecified: Secondary | ICD-10-CM

## 2017-12-21 DIAGNOSIS — Z79899 Other long term (current) drug therapy: Secondary | ICD-10-CM | POA: Insufficient documentation

## 2017-12-21 DIAGNOSIS — R05 Cough: Secondary | ICD-10-CM | POA: Insufficient documentation

## 2017-12-21 LAB — CBC WITH DIFFERENTIAL/PLATELET
Abs Immature Granulocytes: 0.02 10*3/uL (ref 0.00–0.07)
Basophils Absolute: 0.1 10*3/uL (ref 0.0–0.1)
Basophils Relative: 1 %
EOS ABS: 0.2 10*3/uL (ref 0.0–0.5)
Eosinophils Relative: 4 %
HCT: 46.2 % (ref 39.0–52.0)
Hemoglobin: 15.3 g/dL (ref 13.0–17.0)
IMMATURE GRANULOCYTES: 0 %
Lymphocytes Relative: 36 %
Lymphs Abs: 2.2 10*3/uL (ref 0.7–4.0)
MCH: 30.9 pg (ref 26.0–34.0)
MCHC: 33.1 g/dL (ref 30.0–36.0)
MCV: 93.3 fL (ref 80.0–100.0)
MONO ABS: 0.6 10*3/uL (ref 0.1–1.0)
MONOS PCT: 10 %
NEUTROS ABS: 3 10*3/uL (ref 1.7–7.7)
NEUTROS PCT: 49 %
Platelets: 307 10*3/uL (ref 150–400)
RBC: 4.95 MIL/uL (ref 4.22–5.81)
RDW: 11.9 % (ref 11.5–15.5)
WBC: 6.1 10*3/uL (ref 4.0–10.5)
nRBC: 0 % (ref 0.0–0.2)

## 2017-12-21 LAB — COMPREHENSIVE METABOLIC PANEL
ALT: 45 U/L — ABNORMAL HIGH (ref 0–44)
AST: 32 U/L (ref 15–41)
Albumin: 4.1 g/dL (ref 3.5–5.0)
Alkaline Phosphatase: 68 U/L (ref 38–126)
Anion gap: 7 (ref 5–15)
BILIRUBIN TOTAL: 0.6 mg/dL (ref 0.3–1.2)
BUN: 9 mg/dL (ref 6–20)
CHLORIDE: 106 mmol/L (ref 98–111)
CO2: 27 mmol/L (ref 22–32)
Calcium: 9.3 mg/dL (ref 8.9–10.3)
Creatinine, Ser: 0.89 mg/dL (ref 0.61–1.24)
Glucose, Bld: 102 mg/dL — ABNORMAL HIGH (ref 70–99)
POTASSIUM: 3.7 mmol/L (ref 3.5–5.1)
Sodium: 140 mmol/L (ref 135–145)
TOTAL PROTEIN: 6.9 g/dL (ref 6.5–8.1)

## 2017-12-21 LAB — LIPASE, BLOOD: LIPASE: 35 U/L (ref 11–51)

## 2017-12-21 MED ORDER — ALBUTEROL SULFATE HFA 108 (90 BASE) MCG/ACT IN AERS: 2.0000 | INHALATION_SPRAY | RESPIRATORY_TRACT | 0 refills | Status: DC | PRN

## 2017-12-21 MED ORDER — ACETAMINOPHEN 325 MG PO TABS
650.0000 mg | ORAL_TABLET | Freq: Once | ORAL | Status: AC
Start: 2017-12-21 — End: 2017-12-21
  Administered 2017-12-21: 650 mg via ORAL
  Filled 2017-12-21: qty 2

## 2017-12-21 MED ORDER — METHOCARBAMOL 500 MG PO TABS: 500.0000 mg | ORAL_TABLET | Freq: Two times a day (BID) | ORAL | 0 refills | Status: DC

## 2017-12-21 MED ORDER — PREDNISONE 10 MG (21) PO TBPK: ORAL_TABLET | ORAL | 0 refills | Status: DC

## 2017-12-21 MED ORDER — LIDOCAINE 5 % EX PTCH: 1.0000 | MEDICATED_PATCH | CUTANEOUS | 0 refills | Status: DC

## 2017-12-21 MED ORDER — IBUPROFEN 200 MG PO TABS
600.0000 mg | ORAL_TABLET | Freq: Once | ORAL | Status: AC
Start: 2017-12-21 — End: 2017-12-21
  Administered 2017-12-21: 08:00:00 600 mg via ORAL
  Filled 2017-12-21: qty 1

## 2017-12-21 MED ORDER — BENZONATATE 100 MG PO CAPS: 100.0000 mg | ORAL_CAPSULE | Freq: Three times a day (TID) | ORAL | 0 refills | Status: DC

## 2017-12-21 NOTE — ED Notes (Signed)
Pt left for xray

## 2017-12-21 NOTE — Discharge Instructions (Addendum)
Your symptoms are likely consistent with a viral illness. Viruses do not require or respond to antibiotics. Treatment is symptomatic care and it is important to note that these symptoms may last for 7-14 days.   Hand washing: Wash your hands throughout the day, but especially before and after touching the face, using the restroom, sneezing, coughing, or touching surfaces that have been coughed or sneezed upon. Hydration: Symptoms will be intensified and complicated by dehydration. Dehydration can also extend the duration of symptoms. Drink plenty of fluids and get plenty of rest. You should be drinking at least half a liter of water an hour to stay hydrated. Electrolyte drinks (ex. Gatorade, Powerade, Pedialyte) are also encouraged. You should be drinking enough fluids to make your urine light yellow, almost clear. If this is not the case, you are not drinking enough water. Please note that some of the treatments indicated below will not be effective if you are not adequately hydrated. Pain or fever: Ibuprofen, Naproxen, or acetaminophen (generic for Tylenol) for pain or fever.  Antiinflammatory medications: Take 600 mg of ibuprofen every 6 hours or 440 mg (over the counter dose) to 500 mg (prescription dose) of naproxen every 12 hours for the next 3 days. After this time, these medications may be used as needed for pain. Take these medications with food to avoid upset stomach. Choose only one of these medications, do not take them together. Acetaminophen (generic for Tylenol): Should you continue to have additional pain while taking the ibuprofen or naproxen, you may add in acetaminophen as needed. Your daily total maximum amount of acetaminophen from all sources should be limited to 4000mg /day for persons without liver problems, or 2000mg /day for those with liver problems. Robaxin: Robaxin is a muscle relaxer and can help relieve stiff muscles or muscle spasms.  Do not drive or perform other dangerous  activities while taking the Robaxin. Diarrhea: May use medications such as loperamide (Imodium) or Bismuth subsalicylate (Pepto-Bismol). Cough: Use the benzonatate (generic for Tessalon) for cough.  Albuterol: May use the albuterol as needed for instances of shortness of breath. Prednisone: Take the prednisone, as directed, in its entirety. Zyrtec or Claritin: May add these medication daily to control underlying symptoms of congestion, sneezing, and other signs of allergies.  These medications are available over-the-counter. Generics: Cetirizine (generic for Zyrtec) and loratadine (generic for Claritin). Fluticasone: Use fluticasone (generic for Flonase), as directed, for nasal and sinus congestion.  This medication is available over-the-counter. Congestion: Plain guaifenesin (generic for plain Mucinex) may help relieve congestion. Saline sinus rinses and saline nasal sprays may also help relieve congestion. If you do not have heart problems or an allergy to such medications, you may also try phenylephrine or Sudafed. Follow up: Follow up with a primary care provider within the next two weeks should symptoms fail to resolve. Return: Return to the ED for significantly worsening symptoms, shortness of breath, persistent vomiting, large amounts of blood in stool, or any other major concerns.  You had a minor elevation in a single liver enzyme. This is nonspecific at this time.  This should be retested by primary care provider.  For prescription assistance, may try using prescription discount sites or apps, such as goodrx.com

## 2017-12-21 NOTE — ED Provider Notes (Signed)
[facility] EMERGENCY DEPARTMENT Provider Note   CSN: [id number] Arrival date & time: 12/21/17  0865     History   Chief Complaint Chief Complaint  Patient presents with  . Cough    HPI Cody Dean is a 27 y.o. male.  HPI   Cody Dean is a 27 y.o. male, with a history of anxiety, presenting to the ED with patient with productive cough with yellow sputum for the past week.  He also complains of right lower chest pain beginning after coughing began. Usually is a soreness, but sometimes notes spasms, especially after coughing.  Also notes intermittent subjective fever, nasal congestion, rhinorrhea, sneezing, and loose stools.   He has intermittently been taking ibuprofen with temporary relief.  His last dose was around 10 PM last night.  Denies shortness of breath, abdominal pain, urinary symptoms, nausea/vomiting, hematochezia, or any other complaints.    Past Medical History:  Diagnosis Date  . Anxiety     There are no active problems to display for this patient.   History reviewed. No pertinent surgical history.      Home Medications    Prior to Admission medications   Medication Sig Start Date End Date Taking? Authorizing Provider  albuterol (PROVENTIL HFA;VENTOLIN HFA) 108 (90 Base) MCG/ACT inhaler Inhale 2 puffs into the lungs every 4 (four) hours as needed for wheezing or shortness of breath. 12/21/17   ,  C, PA-C  benzonatate (TESSALON) 100 MG capsule Take 1 capsule (100 mg total) by mouth every 8 (eight) hours. 12/21/17   ,  C, PA-C  cephALEXin (KEFLEX) 500 MG capsule Take 1 capsule (500 mg total) by mouth 2 (two) times daily. Patient not taking: Reported on 01/23/2017 09/01/15   Barrett Henle, PA-C  chlordiazePOXIDE (LIBRIUM) 25 MG capsule 50mg  PO TID x 1D, then 25-50mg  PO BID X 1D, then 25-50mg  PO QD X 1D 01/23/17   Shaune Pollack, MD  HYDROcodone-acetaminophen (NORCO/VICODIN) 5-325 MG tablet Take 2 tablets  by mouth every 4 (four) hours as needed. Patient not taking: Reported on 01/23/2017 09/01/15   Barrett Henle, PA-C  ibuprofen (ADVIL,MOTRIN) 200 MG tablet Take 400 mg by mouth every 6 (six) hours as needed for headache or mild pain.    [provider]  ibuprofen (ADVIL,MOTRIN) 600 MG tablet Take 1 tablet (600 mg total) by mouth every 6 (six) hours as needed. Patient not taking: Reported on 01/23/2017 09/01/15   Barrett Henle, PA-C  lidocaine (LIDODERM) 5 % Place 1 patch onto the skin daily. Remove & Discard patch within 12 hours or as directed by MD 12/21/17   ,  C, PA-C  methocarbamol (ROBAXIN) 500 MG tablet Take 1 tablet (500 mg total) by mouth 2 (two) times daily. 12/21/17   ,  C, PA-C  naproxen (NAPROSYN) 500 MG tablet Take 1 tablet (500 mg total) by mouth 2 (two) times daily. Patient not taking: Reported on 01/23/2017 07/26/14   Fayrene Helper, PA-C  naproxen (NAPROSYN) 500 MG tablet Take 1 tablet (500 mg total) by mouth 2 (two) times daily with a meal. Patient not taking: Reported on 01/23/2017 11/03/14   Barbaraann Barthel, MD  penicillin v potassium (VEETID) 500 MG tablet Take 1 tablet (500 mg total) by mouth 3 (three) times daily. Patient not taking: Reported on 01/23/2017 07/26/14   Fayrene Helper, PA-C  predniSONE (STERAPRED UNI-PAK 21 TAB) 10 MG (21) TBPK tablet Take 6 tabs (60mg ) day 1, 5 tabs (50mg ) day 2, 4 tabs (  40mg ) day 3, 3 tabs (30mg ) day 4, 2 tabs (20mg ) day 5, and 1 tab (10mg ) day 6. 12/21/17   ,  C, PA-C  thiamine (VITAMIN B-1) 100 MG tablet Take 1 tablet (100 mg total) by mouth daily. 01/23/17   Shaune Pollack, MD    Family History History reviewed. No pertinent family history.  Social History Social History   Tobacco Use  . Smoking status: Current Every Day Smoker    Types: Cigarettes  . Smokeless tobacco: Never Used  Substance Use Topics  . Alcohol use: Yes  . Drug use: Not on file     Allergies   Patient has no known  allergies.   Review of Systems Review of Systems  Constitutional: Positive for fever (subjective). Negative for diaphoresis.  HENT: Positive for congestion, rhinorrhea and sneezing. Negative for sore throat and trouble swallowing.   Respiratory: Positive for cough. Negative for shortness of breath.   Gastrointestinal: Positive for diarrhea. Negative for abdominal pain, blood in stool, nausea and vomiting.  Genitourinary: Negative for dysuria, flank pain and hematuria.  Musculoskeletal: Negative for back pain.  Neurological: Negative for dizziness, syncope, light-headedness and headaches.  All other systems reviewed and are negative.    Physical Exam Updated Vital Signs BP 140/77 (BP Location: Right Arm)   Pulse 69   Temp 97.9 F (36.6 C) (Oral)   Resp 16   Ht 5\' 7"  (1.702 m)   Wt 78.5 kg   SpO2 100%   BMI 27.10 kg/m   Physical Exam  Constitutional: He appears well-developed and well-nourished. No distress.  HENT:  Head: Normocephalic and atraumatic.  Nose: Mucosal edema and rhinorrhea present.  Mouth/Throat: Uvula is midline and oropharynx is clear and moist.  Eyes: Conjunctivae are normal.  Neck: Normal range of motion. Neck supple.  Cardiovascular: Normal rate, regular rhythm, normal heart sounds and intact distal pulses.  Pulmonary/Chest: Effort normal and breath sounds normal. No respiratory distress. He exhibits tenderness.  No increased work of breathing.  Speaks in full sentences without difficulty.    Abdominal: Soft. Bowel sounds are normal. There is no tenderness. There is no guarding and negative Murphy's sign.  Musculoskeletal: He exhibits no edema.  Lymphadenopathy:    He has no cervical adenopathy.  Neurological: He is alert.  Skin: Skin is warm and dry. He is not diaphoretic.  Psychiatric: He has a normal mood and affect. His behavior is normal.  Nursing note and vitals reviewed.    ED Treatments / Results  Labs (all labs ordered are listed, but  only abnormal results are displayed) Labs Reviewed  COMPREHENSIVE METABOLIC PANEL - Abnormal; Notable for the following components:      Result Value   Glucose, Bld 102 (*)    ALT 45 (*)    All other components within normal limits  LIPASE, BLOOD  CBC WITH DIFFERENTIAL/PLATELET    EKG None  Radiology Dg Chest 2 View  Result Date: 12/21/2017 CLINICAL DATA:  Right upper quadrant abdominal pain, 1 week of cough, occasional shortness of breath. Current smoker. EXAM: CHEST - 2 VIEW COMPARISON:  Chest x-ray of April 05, 2008 FINDINGS: The lungs are adequately inflated. There is no focal infiltrate. The interstitial markings are slightly more conspicuous bilaterally than on the study from 20/10. There is no pleural effusion. The heart and pulmonary vascularity are normal. The mediastinum is normal in width. The bony thorax exhibits no acute abnormality. The observed portions of the upper abdomen exhibit no acute abnormalities. IMPRESSION: Mild bronchitic-smoking  related changes. No alveolar pneumonia nor other acute cardiopulmonary abnormality. Electronically Signed   By: David  [place] M.D.   On: 12/21/2017 08:24    Procedures Procedures (including critical care time)  Medications Ordered in ED Medications  ibuprofen (ADVIL,MOTRIN) tablet 600 mg (600 mg Oral Given 12/21/17 0821)  acetaminophen (TYLENOL) tablet 650 mg (650 mg Oral Given 12/21/17 3086)     Initial Impression / Assessment and Plan / ED Course  I have reviewed the triage vital signs and the nursing notes.  Pertinent labs & imaging results that were available during my care of the patient were reviewed by me and considered in my medical decision making (see chart for details).     Patient presents with cough and pain in the right lower chest.  Bronchitic changes on chest x-ray. Patient is nontoxic appearing, afebrile, not tachycardic, not tachypneic, not hypotensive, maintains excellent SPO2 on room air, and is in no  apparent distress.  He does not seem to have tenderness in the abdomen, only to the chest wall.  Additional lab work was obtained, however, due to the vicinity to the gallbladder.  He had mild, isolated ALT elevation, nonspecific in this setting.  He will have this retested within the next couple weeks by a PCP. The patient was given instructions for home care as well as return precautions. Patient voices understanding of these instructions, accepts the plan, and is comfortable with discharge.    Patient's hypertension is noted.  He does not seem to be symptomatic to it at this time.  We discussed follow-up with PCP on this matter.  Final Clinical Impressions(s) / ED Diagnoses   Final diagnoses:  Cough    ED Discharge Orders         Ordered    benzonatate (TESSALON) 100 MG capsule  Every 8 hours     12/21/17 0910    methocarbamol (ROBAXIN) 500 MG tablet  2 times daily     12/21/17 0910    lidocaine (LIDODERM) 5 %  Every 24 hours     12/21/17 0910    predniSONE (STERAPRED UNI-PAK 21 TAB) 10 MG (21) TBPK tablet     12/21/17 0910    albuterol (PROVENTIL HFA;VENTOLIN HFA) 108 (90 Base) MCG/ACT inhaler  Every 4 hours PRN     12/21/17 0910           Anselm Pancoast, PA-C 12/21/17 1115    Vanetta Mulders, MD 12/24/17 1258

## 2017-12-21 NOTE — ED Triage Notes (Signed)
Pt comes in complaining of RUQ abdominal pain.  Complains of cough x1 week and occasional SOB AOx4 NAD noted at this time.

## 2019-01-20 IMAGING — DX DG CHEST 2V
2 series · 2 of 2 positions shown · non-contrast
Comparison: Chest x-ray of April 05, 2008

CLINICAL DATA: Right upper quadrant abdominal pain, 1 week of
cough, occasional shortness of breath. Current smoker.

EXAM:
CHEST - 2 VIEW

[w chest pa]
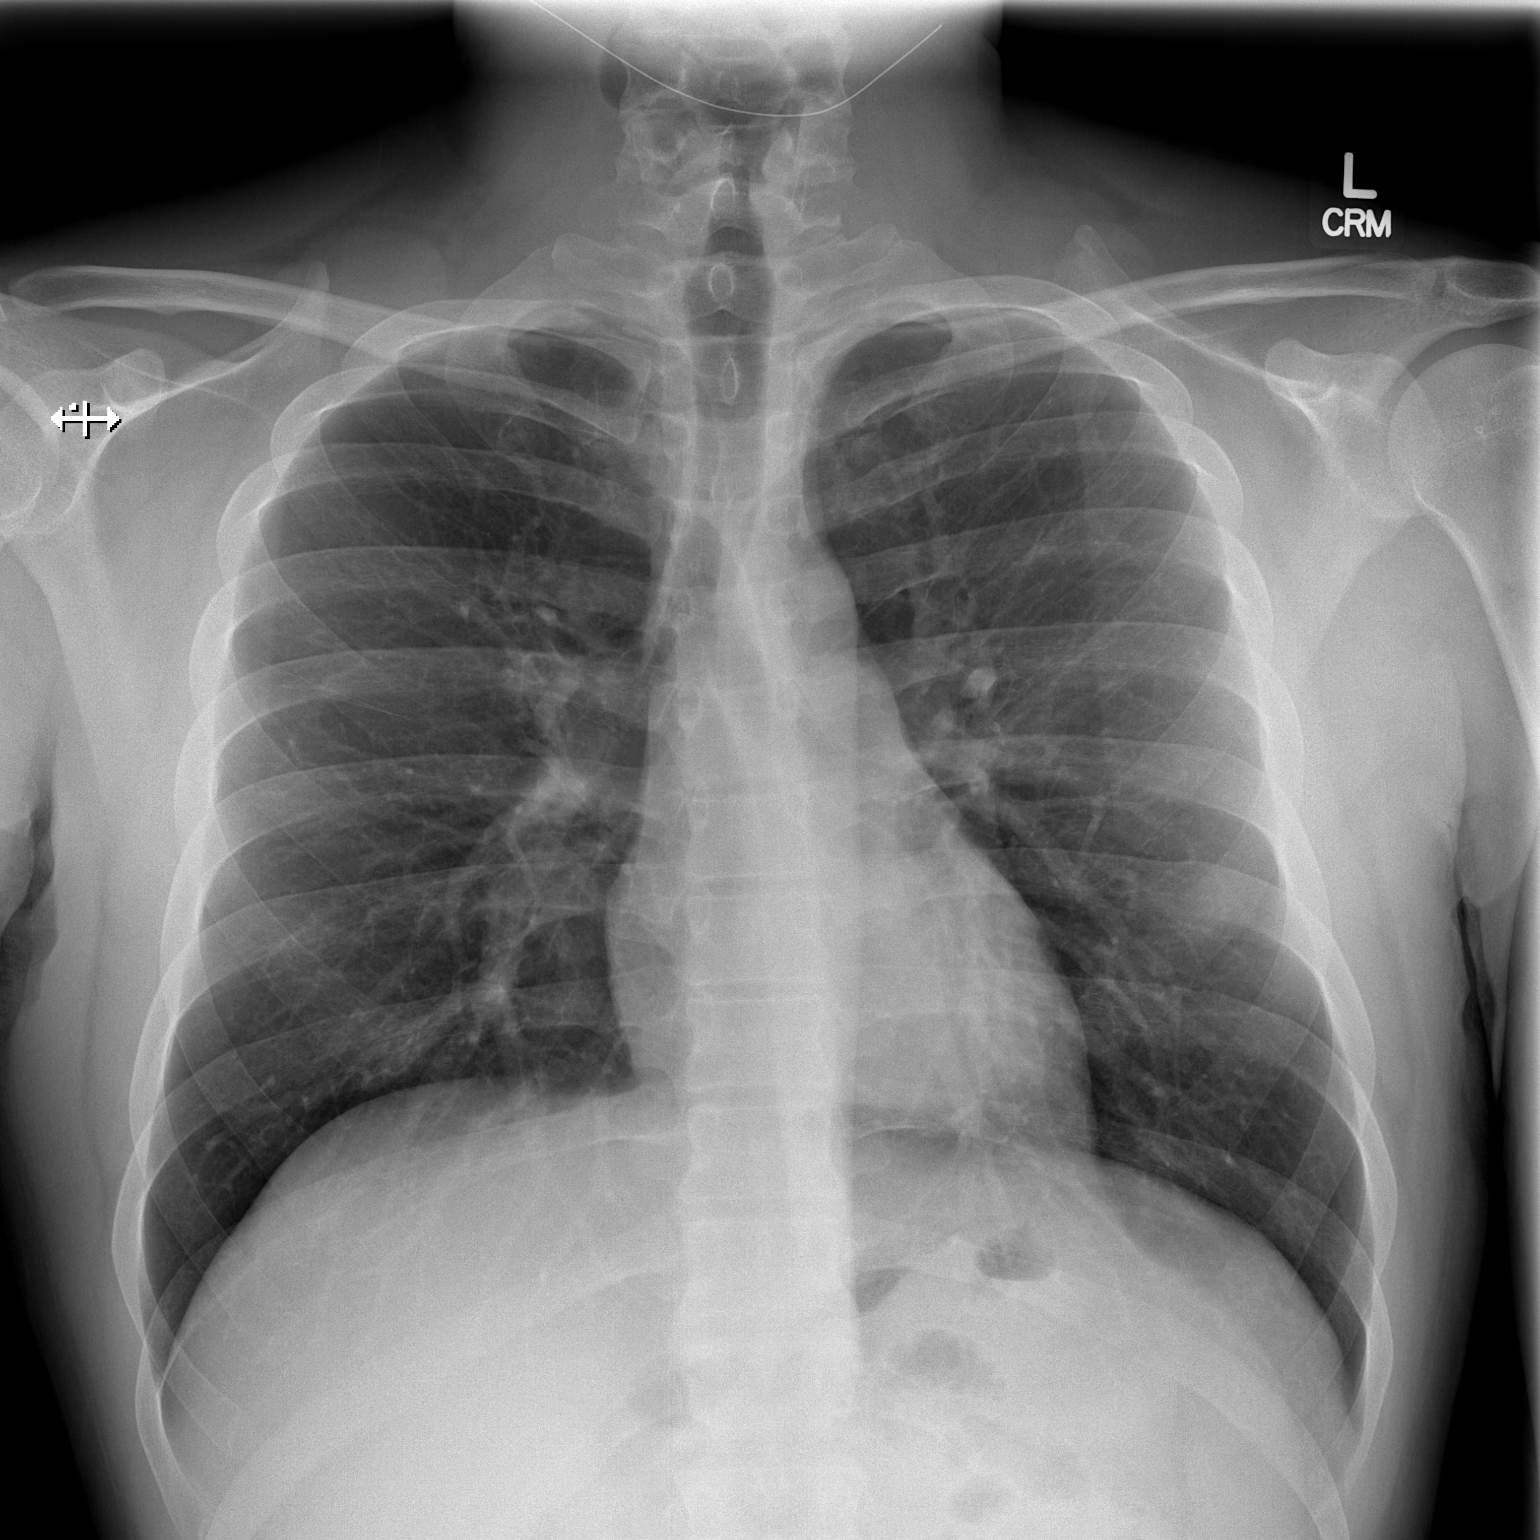

[w chest lat]
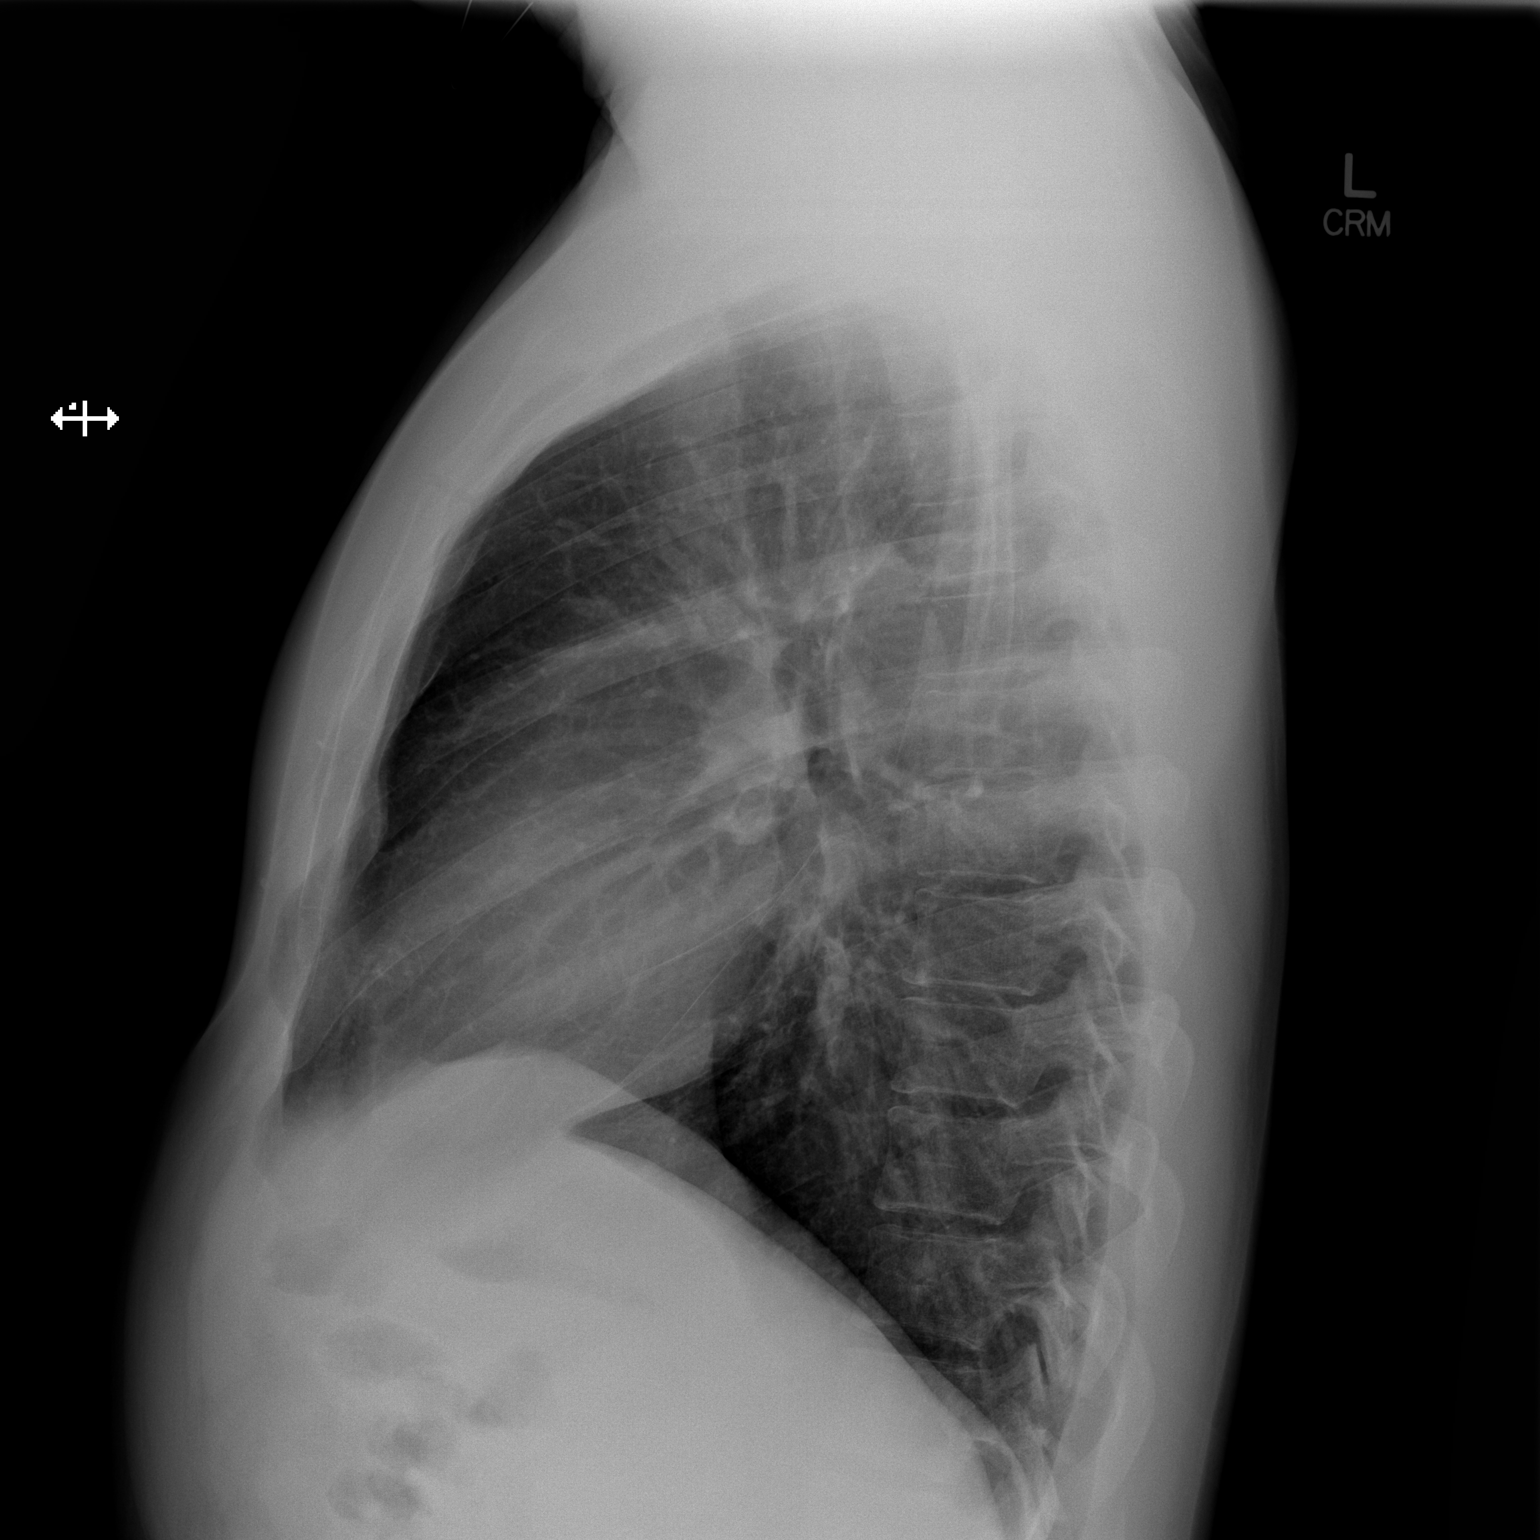

[2 of 2 positions shown; findings below may reference images not displayed]

FINDINGS: The lungs are adequately inflated. There is no focal infiltrate. The
interstitial markings are slightly more conspicuous bilaterally than
on the study from [DATE]. There is no pleural effusion. The heart and
pulmonary vascularity are normal. The mediastinum is normal in
width. The bony thorax exhibits no acute abnormality. The observed
portions of the upper abdomen exhibit no acute abnormalities.
IMPRESSION: Mild bronchitic-smoking related changes. No alveolar pneumonia nor
other acute cardiopulmonary abnormality.

## 2021-06-18 ENCOUNTER — Ambulatory Visit
Admission: EM | Admit: 2021-06-18 | Discharge: 2021-06-18 | Disposition: A | Discharge status: Home / Self Care | Payer: BC Managed Care – PPO | Attending: Emergency Medicine | Admitting: Emergency Medicine

## 2021-06-18 DIAGNOSIS — J4521 Mild intermittent asthma with (acute) exacerbation: Secondary | ICD-10-CM | POA: Diagnosis not present

## 2021-06-18 DIAGNOSIS — J310 Chronic rhinitis: Secondary | ICD-10-CM | POA: Diagnosis not present

## 2021-06-18 DIAGNOSIS — H6992 Unspecified Eustachian tube disorder, left ear: Secondary | ICD-10-CM

## 2021-06-18 DIAGNOSIS — J989 Respiratory disorder, unspecified: Secondary | ICD-10-CM

## 2021-06-18 DIAGNOSIS — J302 Other seasonal allergic rhinitis: Secondary | ICD-10-CM | POA: Diagnosis not present

## 2021-06-18 DIAGNOSIS — H6982 Other specified disorders of Eustachian tube, left ear: Secondary | ICD-10-CM

## 2021-06-18 DIAGNOSIS — J3089 Other allergic rhinitis: Secondary | ICD-10-CM

## 2021-06-18 DIAGNOSIS — T7840XA Allergy, unspecified, initial encounter: Secondary | ICD-10-CM

## 2021-06-18 DIAGNOSIS — J329 Chronic sinusitis, unspecified: Secondary | ICD-10-CM

## 2021-06-18 MED ORDER — IBUPROFEN 400 MG PO TABS: 400.0000 mg | ORAL_TABLET | Freq: Three times a day (TID) | ORAL | 0 refills | Status: AC | PRN

## 2021-06-18 MED ORDER — ALBUTEROL SULFATE HFA 108 (90 BASE) MCG/ACT IN AERS: 2.0000 | INHALATION_SPRAY | Freq: Four times a day (QID) | RESPIRATORY_TRACT | 2 refills | Status: AC | PRN

## 2021-06-18 MED ORDER — FLUTICASONE PROPIONATE 50 MCG/ACT NA SUSP: 1.0000 | Freq: Every day | NASAL | 1 refills | Status: AC

## 2021-06-18 MED ORDER — MONTELUKAST SODIUM 10 MG PO TABS: 10.0000 mg | ORAL_TABLET | Freq: Every day | ORAL | 2 refills | Status: AC

## 2021-06-18 MED ORDER — FEXOFENADINE HCL 180 MG PO TABS: 180.0000 mg | ORAL_TABLET | Freq: Every day | ORAL | 1 refills | Status: AC

## 2021-06-18 MED ORDER — IPRATROPIUM BROMIDE 0.06 % NA SOLN: 2.0000 | Freq: Three times a day (TID) | NASAL | 1 refills | Status: AC

## 2021-06-18 MED ORDER — AMOXICILLIN-POT CLAVULANATE 875-125 MG PO TABS: 1.0000 | ORAL_TABLET | Freq: Two times a day (BID) | ORAL | 0 refills | Status: AC

## 2021-06-18 MED ORDER — TRIAMCINOLONE ACETONIDE 40 MG/ML IJ SUSP
40.0000 mg | Freq: Once | INTRAMUSCULAR | Status: AC
  Administered 2021-06-18: 40 mg via INTRAMUSCULAR

## 2021-06-18 NOTE — Discharge Instructions (Addendum)
Your symptoms and my physical exam findings are concerning for exacerbation of your underlying allergies.  It is important that you begin your allergy regimen now and are consistent with taking allergy medications exactly as prescribed.  Allergy medications are preventative and therefore only work well when they are taken daily, not "as needed". ?  ?Not taking all of your allergy medications as they have been prescribed for you and in the combination in which they have been prescribed can increase your risk of getting more frequent upper respiratory infections, lower respiratory disorders, skin reactions, and eye irritations that may or may not require the use of antibiotics and steroids and can result in loss of time at work, celebrations with family and friends as well as missed social opportunities. ?  ?Please see the list below for recommended medications, dosages and frequencies to provide relief of current symptoms:   ?  ?Kenalog IM (triamcinolone):  To quickly address your significant respiratory inflammation, you were provided with an injection of Kenalog in the office today.  You should continue to feel the full benefit of the steroid for the next 24-36 hours.  ?  ?Advil, Motrin (ibuprofen): This is a good anti-inflammatory medication which not only addresses aches, pains but also significantly reduces soft tissue inflammation of the upper airways that causes sinus and nasal congestion as well as inflammation of the lower airways which makes you feel like your breathing is constricted or your cough feel tight.  I recommend that you take between 400 mg every 8 hours as needed.    ?  ?Allegra (fexofenadine): This is an excellent second-generation antihistamine that helps to reduce respiratory inflammatory response to environmental allergens.  This medication is not known to cause daytime sleepiness so it can be taken in the daytime.  If you find that it does make you sleepy, please feel free to take it at  bedtime.  I provided you with a prescription.  I also provided you with a coupon just in case your insurance will not cover it. ?  ?Singulair (montelukast): This is a mast cell stabilizer that works well with antihistamines.  Mast cells are responsible for stimulating histamine production so you can imagine that if we can reduce the activity of your mast cells, then fewer histamines will be produced and inflammation caused by allergy exposure will be significantly reduced.  I recommend that you take this medication at the same time you take your antihistamine.  I recommend that you continue this medication for 30 days but if you would like to take it throughout the summer as well, I have provided you with refills. ?  ?Flonase (fluticasone): This is a steroid nasal spray that you use once daily, 1 spray in each nare.  This medication does not work well if you decide to use it only used as you feel you need to, it works best used on a daily basis.  After 3 to 5 days of use, you will notice significant reduction of the inflammation and mucus production that is currently being caused by exposure to allergens, whether seasonal or environmental.  The most common side effect of this medication is nosebleeds.  If you experience a nosebleed, please discontinue use for 1 week, then feel free to resume.  I have provided you with a prescription.  I have also provided you with a coupon just in case your insurance will not cover it. ?  ?Atrovent (ipratropium): This is an excellent nasal decongestant spray I have added to  your recommended nasal steroid that will not cause rebound congestion, please instill 2 sprays into each nare with each use.  Because nasal steroids can take several days before they begin to provide full benefit, I recommend that you use this spray in addition to the nasal steroid prescribed for you.  Please use it after you have used your nasal steroid and repeat up to 4 times daily as needed.  I have provided  you with a prescription for this medication.    ?  ?ProAir, Ventolin, Proventil (albuterol): This inhaled medication contains a short acting beta agonist bronchodilator.  This medication works on the smooth muscle that opens and constricts of your airways by relaxing the muscle.  The result of relaxation of the smooth muscle is increased air movement and improved work of breathing.  This is a short acting medication that can be used every 4-6 hours as needed for increased work of breathing, shortness of breath, wheezing and excessive coughing.  I have provided you with a prescription.  ?  ?As we discussed, your left eardrum appeared to have some "murky" fluid behind it which concerning for early signs of bacterial infection and may also reflect the presence of excessive bacteria in your sinuses at this time which could become complicated if we cannot get ahead of your allergies fast enough. ? ?I do not think that you need to begin antibiotics today but in the next 24 to 72 hours, if you notice that you are having increased sinus pressure or began to have left ear pain despite all of the above medications, please pick up the prescription for Augmentin 1 tablet twice daily for 7 days that I have sent to your pharmacy to treat you for presumed bacterial upper respiratory infection. ? ?If you find that you have not had significant relief of your symptoms in the next 7 to 10 days, please follow-up with your primary care provider or return here to urgent care for repeat evaluation and further recommendations. ?  ?Thank you for visiting urgent care today.  We appreciate the opportunity to participate in your care. ? ? ? ?

## 2021-06-18 NOTE — ED Triage Notes (Signed)
Pt c/o allergies flaring up, he c/o congestion, and facial pressure.  ?

## 2021-06-18 NOTE — ED Provider Notes (Signed)
?[facility] ? ? ? ?CSN: [id number] ?Arrival date & time: 06/18/21  1220 ?  ? ?HISTORY  ? ?Chief Complaint  ?Patient presents with  ? Nasal Congestion  ? ?HPI ?Cody Dean is a 31 y.o. male. Patient presents to urgent care today stating that his allergies are flared up, patient states he is having nasal and sinus congestion along with facial pressure.  Patient denies shortness of breath.  Patient has a history of reactive airway disease previously prescribed albuterol.  Patient states currently not using any albuterol or allergy medications at this time.  Patient has normal vital signs on arrival today.  Patient denies fever, aches, chills, fatigue, nausea, vomiting, diarrhea.  Patient denies known sick contacts. ? ?The history is provided by the patient.  ?Past Medical History:  ?Diagnosis Date  ? Anxiety   ? ?There are no problems to display for this patient. ? ?History reviewed. No pertinent surgical history. ? ?Home Medications   ? ?Prior to Admission medications   ?Not on File  ? ?Family History ?History reviewed. No pertinent family history. ?Social History ?Social History  ? ?Tobacco Use  ? Smoking status: Every Day  ?  Types: Cigarettes  ? Smokeless tobacco: Never  ?Vaping Use  ? Vaping Use: Never used  ?Substance Use Topics  ? Alcohol use: Yes  ? Drug use: Never  ? ?Allergies   ?Patient has no known allergies. ? ?Review of Systems ?Review of Systems ?Pertinent findings noted in history of present illness.  ? ?Physical Exam ?Triage Vital Signs ?ED Triage Vitals  ?Enc Vitals Group  ?   BP 12/13/20 0827 (!) 147/82  ?   Pulse Rate 12/13/20 0827 72  ?   Resp 12/13/20 0827 18  ?   Temp 12/13/20 0827 98.3 ?F (36.8 ?C)  ?   Temp Source 12/13/20 0827 Oral  ?   SpO2 12/13/20 0827 98 %  ?   Weight --   ?   Height --   ?   Head Circumference --   ?   Peak Flow --   ?   Pain Score 12/13/20 0826 5  ?   Pain Loc --   ?   Pain Edu? --   ?   Excl. in GC? --   ?No data found. ? ?Updated Vital Signs ?BP 130/89  (BP Location: Left Arm)   Pulse 78   Temp 98.5 ?F (36.9 ?C) (Oral)   Resp 20   SpO2 96%  ? ?Physical Exam ?Vitals and nursing note reviewed.  ?Constitutional:   ?   General: He is not in acute distress. ?   Appearance: Normal appearance. He is not ill-appearing.  ?HENT:  ?   Head: Normocephalic and atraumatic.  ?   Salivary Glands: Right salivary gland is not diffusely enlarged or tender. Left salivary gland is not diffusely enlarged or tender.  ?   Right Ear: Ear canal and external ear normal. No drainage. A middle ear effusion is present. There is no impacted cerumen. Tympanic membrane is bulging. Tympanic membrane is not injected or erythematous.  ?   Left Ear: Ear canal and external ear normal. No drainage. A middle ear effusion (Mildly suppurative) is present. There is no impacted cerumen. Tympanic membrane is bulging. Tympanic membrane is not injected or erythematous.  ?   Ears:  ?   Comments: Bilateral EACs normal ?   Nose: Mucosal edema, congestion and rhinorrhea present. No nasal deformity, septal deviation, signs of injury or nasal  tenderness. Rhinorrhea is clear.  ?   Right Nostril: Occlusion present. No foreign body, epistaxis or septal hematoma.  ?   Left Nostril: Occlusion present. No foreign body, epistaxis or septal hematoma.  ?   Right Turbinates: Enlarged, swollen and pale.  ?   Left Turbinates: Enlarged, swollen and pale.  ?   Right Sinus: No maxillary sinus tenderness or frontal sinus tenderness.  ?   Left Sinus: No maxillary sinus tenderness or frontal sinus tenderness.  ?   Mouth/Throat:  ?   Lips: Pink. No lesions.  ?   Mouth: Mucous membranes are moist. No oral lesions.  ?   Pharynx: Oropharynx is clear. Uvula midline. No posterior oropharyngeal erythema or uvula swelling.  ?   Tonsils: No tonsillar exudate. 0 on the right. 0 on the left.  ?   Comments: Postnasal drip ?Eyes:  ?   General: Lids are normal.     ?   Right eye: No discharge.     ?   Left eye: No discharge.  ?   Extraocular  Movements: Extraocular movements intact.  ?   Conjunctiva/sclera: Conjunctivae normal.  ?   Right eye: Right conjunctiva is not injected.  ?   Left eye: Left conjunctiva is not injected.  ?Neck:  ?   Trachea: Trachea and phonation normal.  ?Cardiovascular:  ?   Rate and Rhythm: Normal rate and regular rhythm.  ?   Pulses: Normal pulses.  ?   Heart sounds: Normal heart sounds. No murmur heard. ?  No friction rub. No gallop.  ?Pulmonary:  ?   Effort: Pulmonary effort is normal. No accessory muscle usage, prolonged expiration or respiratory distress.  ?   Breath sounds: Normal breath sounds. No stridor, decreased air movement or transmitted upper airway sounds. No decreased breath sounds, wheezing, rhonchi or rales.  ?   Comments: Turbulent breath sounds throughout, scant wheezing in right upper and middle lung fields. ?Chest:  ?   Chest wall: No tenderness.  ?Musculoskeletal:     ?   General: Normal range of motion.  ?   Cervical back: Normal range of motion and neck supple. Normal range of motion.  ?Lymphadenopathy:  ?   Cervical: No cervical adenopathy.  ?Skin: ?   General: Skin is warm and dry.  ?   Findings: No erythema or rash.  ?Neurological:  ?   General: No focal deficit present.  ?   Mental Status: He is alert and oriented to person, place, and time.  ?Psychiatric:     ?   Mood and Affect: Mood normal.     ?   Behavior: Behavior normal.  ? ? ?Visual Acuity ?Right Eye Distance:   ?Left Eye Distance:   ?Bilateral Distance:   ? ?Right Eye Near:   ?Left Eye Near:    ?Bilateral Near:    ? ?UC Couse / Diagnostics / Procedures:  ?  ?EKG ? ?Radiology ?No results found. ? ?Procedures ?Procedures (including critical care time) ? ?UC Diagnoses / Final Clinical Impressions(s)   ?I have reviewed the triage vital signs and the nursing notes. ? ?Pertinent labs & imaging results that were available during my care of the patient were reviewed by me and considered in my medical decision making (see chart for details).   ?Final  diagnoses:  ?Mild intermittent reactive airway disease with acute exacerbation  ?Perennial allergic rhinitis with seasonal variation  ?Rhinosinusitis  ?Eustachian tube dysfunction, left  ?Allergic disorder of respiratory system, initial encounter  ? ?  Patient advised to begin allergy medications as outlined below.  Patient advised that I am concerned he may be developing a left inner ear infection.  Patient advised I do not think he needs begin antibiotics today but he should monitor his symptoms closely for increased sinus pressure particularly on the left and left ear pain.  If these occur, he is already been provided with a prescription for Augmentin that he should pick up and begin.  Return precautions advised. ? ?ED Prescriptions   ? ? Medication Sig Dispense Auth. Provider  ? fexofenadine (ALLEGRA) 180 MG tablet Take 1 tablet (180 mg total) by mouth daily. 90 tablet Theadora Rama Scales, PA-C  ? fluticasone (FLONASE) 50 MCG/ACT nasal spray Place 1 spray into both nostrils daily. Begin by using 2 sprays in each nare daily for 3 to 5 days, then decrease to 1 spray in each nare daily. 32 mL Theadora Rama Scales, PA-C  ? ipratropium (ATROVENT) 0.06 % nasal spray Place 2 sprays into both nostrils 3 (three) times daily. As needed for nasal congestion, runny nose 15 mL Theadora Rama Scales, PA-C  ? amoxicillin-clavulanate (AUGMENTIN) 875-125 MG tablet Take 1 tablet by mouth 2 (two) times daily for 7 days. 14 tablet Theadora Rama Scales, PA-C  ? montelukast (SINGULAIR) 10 MG tablet Take 1 tablet (10 mg total) by mouth at bedtime. 30 tablet Theadora Rama Scales, PA-C  ? albuterol (VENTOLIN HFA) 108 (90 Base) MCG/ACT inhaler Inhale 2 puffs into the lungs every 6 (six) hours as needed for wheezing or shortness of breath (Cough). 18 g Theadora Rama Scales, PA-C  ? ibuprofen (ADVIL) 400 MG tablet Take 1 tablet (400 mg total) by mouth every 8 (eight) hours as needed for up to 30 doses. 30 tablet Theadora Rama  Scales, PA-C  ? ?  ? ?PDMP not reviewed this encounter. ? ?Pending results:  ?Labs Reviewed - No data to display ? ?Medications Ordered in UC: ?Medications  ?triamcinolone acetonide (KENALOG-40) injection 40

## 2021-06-20 ENCOUNTER — Ambulatory Visit: Payer: BC Managed Care – PPO | Admitting: Family

## 2023-06-14 ENCOUNTER — Emergency Department (HOSPITAL_COMMUNITY): Payer: Self-pay

## 2023-06-14 ENCOUNTER — Encounter (HOSPITAL_COMMUNITY): Payer: Self-pay

## 2023-06-14 ENCOUNTER — Other Ambulatory Visit: Payer: Self-pay

## 2023-06-14 ENCOUNTER — Emergency Department (HOSPITAL_COMMUNITY)
Admission: EM | Admit: 2023-06-14 | Discharge: 2023-06-14 | Disposition: A | Discharge status: Home / Self Care | Payer: Self-pay

## 2023-06-14 DIAGNOSIS — Z7951 Long term (current) use of inhaled steroids: Secondary | ICD-10-CM | POA: Insufficient documentation

## 2023-06-14 DIAGNOSIS — R1084 Generalized abdominal pain: Secondary | ICD-10-CM | POA: Insufficient documentation

## 2023-06-14 DIAGNOSIS — R109 Unspecified abdominal pain: Secondary | ICD-10-CM

## 2023-06-14 DIAGNOSIS — J45909 Unspecified asthma, uncomplicated: Secondary | ICD-10-CM | POA: Insufficient documentation

## 2023-06-14 LAB — URINALYSIS, ROUTINE W REFLEX MICROSCOPIC
Bilirubin Urine: NEGATIVE
Glucose, UA: NEGATIVE mg/dL
Hgb urine dipstick: NEGATIVE
Ketones, ur: NEGATIVE mg/dL
Leukocytes,Ua: NEGATIVE
Nitrite: NEGATIVE
Protein, ur: NEGATIVE mg/dL
Specific Gravity, Urine: 1.008 (ref 1.005–1.030)
pH: 8 (ref 5.0–8.0)

## 2023-06-14 LAB — CBC WITH DIFFERENTIAL/PLATELET
Abs Immature Granulocytes: 0.03 10*3/uL (ref 0.00–0.07)
Basophils Absolute: 0.1 10*3/uL (ref 0.0–0.1)
Basophils Relative: 1 %
Eosinophils Absolute: 0.3 10*3/uL (ref 0.0–0.5)
Eosinophils Relative: 4 %
HCT: 47.6 % (ref 39.0–52.0)
Hemoglobin: 16.7 g/dL (ref 13.0–17.0)
Immature Granulocytes: 0 %
Lymphocytes Relative: 27 %
Lymphs Abs: 1.9 10*3/uL (ref 0.7–4.0)
MCH: 33.1 pg (ref 26.0–34.0)
MCHC: 35.1 g/dL (ref 30.0–36.0)
MCV: 94.3 fL (ref 80.0–100.0)
Monocytes Absolute: 0.5 10*3/uL (ref 0.1–1.0)
Monocytes Relative: 7 %
Neutro Abs: 4.3 10*3/uL (ref 1.7–7.7)
Neutrophils Relative %: 61 %
Platelets: 286 10*3/uL (ref 150–400)
RBC: 5.05 MIL/uL (ref 4.22–5.81)
RDW: 11.8 % (ref 11.5–15.5)
WBC: 7.1 10*3/uL (ref 4.0–10.5)
nRBC: 0 % (ref 0.0–0.2)

## 2023-06-14 LAB — COMPREHENSIVE METABOLIC PANEL WITH GFR
ALT: 49 U/L — ABNORMAL HIGH (ref 0–44)
AST: 32 U/L (ref 15–41)
Albumin: 4.3 g/dL (ref 3.5–5.0)
Alkaline Phosphatase: 64 U/L (ref 38–126)
Anion gap: 8 (ref 5–15)
BUN: 11 mg/dL (ref 6–20)
CO2: 25 mmol/L (ref 22–32)
Calcium: 9.1 mg/dL (ref 8.9–10.3)
Chloride: 104 mmol/L (ref 98–111)
Creatinine, Ser: 0.84 mg/dL (ref 0.61–1.24)
GFR, Estimated: >60 mL/min (ref >60)
Glucose, Bld: 98 mg/dL (ref 70–99)
Potassium: 3.9 mmol/L (ref 3.5–5.1)
Sodium: 137 mmol/L (ref 135–145)
Total Bilirubin: 1.4 mg/dL — ABNORMAL HIGH (ref 0.0–1.2)
Total Protein: 7.8 g/dL (ref 6.5–8.1)

## 2023-06-14 LAB — LIPASE, BLOOD: Lipase: 29 U/L (ref 11–51)

## 2023-06-14 MED ORDER — ONDANSETRON HCL 4 MG/2ML IJ SOLN
4.0000 mg | Freq: Once | INTRAMUSCULAR | Status: AC
  Administered 2023-06-14: 4 mg via INTRAVENOUS
  Filled 2023-06-14: qty 2

## 2023-06-14 MED ORDER — SUCRALFATE 1 G PO TABS: 1.0000 g | ORAL_TABLET | Freq: Three times a day (TID) | ORAL | 0 refills | Status: AC

## 2023-06-14 MED ORDER — PANTOPRAZOLE SODIUM 20 MG PO TBEC: 20.0000 mg | DELAYED_RELEASE_TABLET | Freq: Every day | ORAL | 0 refills | Status: AC

## 2023-06-14 MED ORDER — ONDANSETRON 4 MG PO TBDP: 4.0000 mg | ORAL_TABLET | Freq: Three times a day (TID) | ORAL | 0 refills | Status: AC | PRN

## 2023-06-14 MED ORDER — ALUM & MAG HYDROXIDE-SIMETH 200-200-20 MG/5ML PO SUSP
30.0000 mL | Freq: Once | ORAL | Status: AC
  Administered 2023-06-14: 30 mL via ORAL
  Filled 2023-06-14: qty 30

## 2023-06-14 MED ORDER — IOHEXOL 300 MG/ML  SOLN
100.0000 mL | Freq: Once | INTRAMUSCULAR | Status: AC | PRN
  Administered 2023-06-14: 100 mL via INTRAVENOUS

## 2023-06-14 NOTE — ED Provider Triage Note (Signed)
 Emergency Medicine Provider Triage Evaluation Note  Cody Dean , a 33 y.o. male  was evaluated in triage.  Pt complains of lower abdominal pain Pt complains of nausea  Review of Systems  Positive: Nausea dn pain right upper and lower abdomen Negative:   Physical Exam  BP (!) 142/107   Pulse 69   Temp 98.2 F (36.8 C) (Oral)   Resp 18   SpO2 99%  Gen:   Awake, no distress   Resp:  Normal effort  MSK:   Moves extremities without difficulty  Other:  Abdomen tender right upper and right lower abdominal pain  Medical Decision Making  Medically screening exam initiated at 1:50 PM.  Appropriate orders placed.  Mads Lorenc was informed that the remainder of the evaluation will be completed by another provider, this initial triage assessment does not replace that evaluation, and the importance of remaining in the ED until their evaluation is complete.     Sandi Crosby, PA-C 06/14/23 1352

## 2023-06-14 NOTE — Discharge Instructions (Addendum)
 We are prescribing his medications to help with some of your symptoms.  You may take them as needed with the exception of Protonix .  Please take that daily.  Please follow-up with your primary doctor as you may need testing for H. pylori.  Return immediately felt fevers, chills, chest pain, shortness of breath, worsening abdominal pain, inability to eat or drink due to nausea vomiting or any new or worsening symptoms that are concerning to you.

## 2023-06-14 NOTE — ED Triage Notes (Signed)
 Pt arrived reporting abdominal pain, RLQ radiating to epigastric area. Onset last night. States went away and then woke up today worse pain. Endorses nausea, no vomiting. Denies any other changes

## 2023-06-14 NOTE — ED Provider Notes (Signed)
 [place] EMERGENCY DEPARTMENT AT [facility] Provider Note   CSN: [id number] Arrival date & time: 06/14/23  1323     History  Chief Complaint  Patient presents with   Abdominal Pain   Nausea    Cody Dean is a 33 y.o. male.  This is a 33 year old male no significant past medical history other than asthma.  No surgical history presenting emergency department for epigastric and right lower quadrant abdominal pain.  Reports symptoms started last night in the epigastrium.  Had some radiation to his right lower quadrant this morning.  Had some nausea, but has not vomited.  Normal bowel movements.  Voiding normally.  No fevers.   Abdominal Pain      Home Medications Prior to Admission medications   Medication Sig Start Date End Date Taking? Authorizing Provider  ondansetron  (ZOFRAN -ODT) 4 MG disintegrating tablet Take 1 tablet (4 mg total) by mouth every 8 (eight) hours as needed for nausea or vomiting. 06/14/23  Yes Rolinda Climes, DO  pantoprazole  (PROTONIX ) 20 MG tablet Take 1 tablet (20 mg total) by mouth daily for 14 days. 06/14/23 06/28/23 Yes Aesha Agrawal, Lajean Pike, DO  sucralfate (CARAFATE) 1 g tablet Take 1 tablet (1 g total) by mouth with breakfast, with lunch, and with evening meal for 7 days. 06/14/23 06/21/23 Yes Clara Smolen, Lajean Pike, DO  albuterol  (VENTOLIN  HFA) 108 (90 Base) MCG/ACT inhaler Inhale 2 puffs into the lungs every 6 (six) hours as needed for wheezing or shortness of breath (Cough). 06/18/21   Eloise Hake Scales, PA-C  fexofenadine  (ALLEGRA ) 180 MG tablet Take 1 tablet (180 mg total) by mouth daily. 06/18/21 12/15/21  Eloise Hake Scales, PA-C  fluticasone  (FLONASE ) 50 MCG/ACT nasal spray Place 1 spray into both nostrils daily. Begin by using 2 sprays in each nare daily for 3 to 5 days, then decrease to 1 spray in each nare daily. 06/18/21   Eloise Hake Scales, PA-C  ibuprofen  (ADVIL ) 400 MG tablet Take 1 tablet (400 mg total) by mouth every 8 (eight)  hours as needed for up to 30 doses. 06/18/21   Eloise Hake Scales, PA-C  ipratropium (ATROVENT ) 0.06 % nasal spray Place 2 sprays into both nostrils 3 (three) times daily. As needed for nasal congestion, runny nose 06/18/21   Eloise Hake Scales, PA-C  montelukast  (SINGULAIR ) 10 MG tablet Take 1 tablet (10 mg total) by mouth at bedtime. 06/18/21 09/16/21  Eloise Hake Scales, PA-C      Allergies    Patient has no known allergies.    Review of Systems   Review of Systems  Gastrointestinal:  Positive for abdominal pain.    Physical Exam Updated Vital Signs BP (!) 135/95   Pulse 75   Temp 98.2 F (36.8 C) (Oral)   Resp 18   SpO2 98%  Physical Exam Vitals reviewed.  Constitutional:      Appearance: He is not toxic-appearing.  HENT:     Head: Normocephalic.  Cardiovascular:     Rate and Rhythm: Normal rate and regular rhythm.  Pulmonary:     Effort: Pulmonary effort is normal.     Breath sounds: Normal breath sounds.  Abdominal:     General: Abdomen is flat.     Palpations: Abdomen is soft.     Tenderness: There is generalized abdominal tenderness and tenderness in the epigastric area.  Skin:    General: Skin is warm and dry.     Capillary Refill: Capillary refill takes less than 2 seconds.  Neurological:     Mental Status: He is alert and oriented to person, place, and time.  Psychiatric:        Mood and Affect: Mood normal.        Behavior: Behavior normal.     ED Results / Procedures / Treatments   Labs (all labs ordered are listed, but only abnormal results are displayed) Labs Reviewed  COMPREHENSIVE METABOLIC PANEL WITH GFR - Abnormal; Notable for the following components:      Result Value   ALT 49 (*)    Total Bilirubin 1.4 (*)    All other components within normal limits  URINALYSIS, ROUTINE W REFLEX MICROSCOPIC - Abnormal; Notable for the following components:   Color, Urine STRAW (*)    All other components within normal limits  CBC WITH  DIFFERENTIAL/PLATELET  LIPASE, BLOOD    EKG None  Radiology CT ABDOMEN PELVIS W CONTRAST Result Date: 06/14/2023 CLINICAL DATA:  Right lower quadrant abdominal pain. EXAM: CT ABDOMEN AND PELVIS WITH CONTRAST TECHNIQUE: Multidetector CT imaging of the abdomen and pelvis was performed using the standard protocol following bolus administration of intravenous contrast. RADIATION DOSE REDUCTION: This exam was performed according to the departmental dose-optimization program which includes automated exposure control, adjustment of the mA and/or kV according to patient size and/or use of iterative reconstruction technique. CONTRAST:  100mL OMNIPAQUE IOHEXOL 300 MG/ML  SOLN COMPARISON:  None Available. FINDINGS: Lower chest: The visualized lung bases are clear. No intra-abdominal free air or free fluid. Hepatobiliary: No focal liver abnormality is seen. No gallstones, gallbladder wall thickening, or biliary dilatation. Pancreas: Unremarkable. No pancreatic ductal dilatation or surrounding inflammatory changes. Spleen: Normal in size without focal abnormality. Adrenals/Urinary Tract: The adrenal glands, kidneys, visualized ureters, and urinary bladder appear unremarkable. Stomach/Bowel: Moderate distal colonic diverticulosis and additional scattered diverticula in the proximal colon. There is no bowel obstruction or active inflammation. The appendix is normal. Vascular/Lymphatic: The abdominal aorta and IVC unremarkable. No portal venous gas. There is no adenopathy. Reproductive: Prostate and seminal vesicles are grossly unremarkable. Other: Small fat containing umbilical hernia. Musculoskeletal: No acute or significant osseous findings. IMPRESSION: 1. No acute intra-abdominal or pelvic pathology. 2. Colonic diverticulosis. No bowel obstruction. Normal appendix. Electronically Signed   By: Angus Bark M.D.   On: 06/14/2023 16:36    Procedures Procedures    Medications Ordered in ED Medications   ondansetron  (ZOFRAN ) injection 4 mg (4 mg Intravenous Given 06/14/23 1539)  alum & mag hydroxide-simeth (MAALOX/MYLANTA) 200-200-20 MG/5ML suspension 30 mL (30 mLs Oral Given 06/14/23 1539)  iohexol (OMNIPAQUE) 300 MG/ML solution 100 mL (100 mLs Intravenous Contrast Given 06/14/23 1617)    ED Course/ Medical Decision Making/ A&P Clinical Course as of 06/14/23 2317  Mon Jun 14, 2023  1456 Per chart review no prior surgeries.  Does not appear to have advanced imaging of abd in our records, But, was seen in January at [facility] ED for generalized abdominal pain; CT at that time showed enteritis.  [TY]  1640 CT ABDOMEN PELVIS W CONTRAST IMPRESSION: 1. No acute intra-abdominal or pelvic pathology. 2. Colonic diverticulosis. No bowel obstruction. Normal appendix.   Electronically Signed   [TY]  1640 Patient is feeling better after GI cocktail.  Labs without abnormality.  Normal electrolytes.  Normal kidney function.  No transaminitis to suggest hepatobiliary disease.  No leukocytosis fever or tachycardia to suggest systemic infection.  UA was negative for acute UTI.  CT scan without acute findings.  Given his improvement after GI cocktail  possible gastritis/ulcer type picture.  Will discharge with Protonix  and Sucre fate.  Patient to follow-up with primary doctor. [TY]    Clinical Course User Index [TY] Rolinda Climes, DO                                 Medical Decision Making This this is a 33 year old male present emergency department with abdominal pain.  He is afebrile nontachycardic, slightly hypertensive.  On exam seemingly more tender in his epigastrium, but although he is complaining of right lower quadrant pain.  Has no prior surgical history.  Concern for possible appendicitis will get CT abdomen.  Will also get basic screening labs.  Will treat with Zofran  and GI cocktail to start and plan to reevaluate patient.  See ED course for further MDM disposition.  Amount and/or Complexity of  Data Reviewed Labs:  Decision-making details documented in ED Course. Radiology:  Decision-making details documented in ED Course. ECG/medicine tests:  Decision-making details documented in ED Course.  Risk OTC drugs. Prescription drug management. Decision regarding hospitalization. Diagnosis or treatment significantly limited by social determinants of health. Risk Details: Poor health literacy          Final Clinical Impression(s) / ED Diagnoses Final diagnoses:  Abdominal pain, unspecified abdominal location    Rx / DC Orders ED Discharge Orders          Ordered    pantoprazole  (PROTONIX ) 20 MG tablet  Daily        06/14/23 1643    sucralfate (CARAFATE) 1 g tablet  3 times daily with meals        06/14/23 1643    ondansetron  (ZOFRAN -ODT) 4 MG disintegrating tablet  Every 8 hours PRN        06/14/23 1643              Elia Nunley, Lajean Pike, DO 06/14/23 2317
